# Patient Record
Sex: Female | Born: 1980 | Race: Black or African American | Hispanic: No | Marital: Single | Smoking: Current some day smoker
Health system: Southern US, Community
[De-identification: ages and names within clinical notes are randomized; demographics above are authoritative.]

## PROBLEM LIST (undated history)

## (undated) DIAGNOSIS — F32A Depression, unspecified: Secondary | ICD-10-CM

## (undated) DIAGNOSIS — F329 Major depressive disorder, single episode, unspecified: Secondary | ICD-10-CM

## (undated) DIAGNOSIS — R569 Unspecified convulsions: Secondary | ICD-10-CM

## (undated) HISTORY — PX: ECTOPIC PREGNANCY SURGERY: SHX613

---

## 2005-10-31 DIAGNOSIS — R569 Unspecified convulsions: Secondary | ICD-10-CM

## 2005-10-31 HISTORY — DX: Unspecified convulsions: R56.9

## 2005-11-11 ENCOUNTER — Ambulatory Visit (HOSPITAL_COMMUNITY): Admission: RE | Admit: 2005-11-11 | Discharge: 2005-11-11 | Payer: Self-pay | Admitting: Obstetrics & Gynecology

## 2005-12-11 ENCOUNTER — Emergency Department (HOSPITAL_COMMUNITY): Admission: EM | Admit: 2005-12-11 | Discharge: 2005-12-11 | Payer: Self-pay | Admitting: *Deleted

## 2005-12-28 ENCOUNTER — Ambulatory Visit (HOSPITAL_COMMUNITY): Admission: RE | Admit: 2005-12-28 | Discharge: 2005-12-28 | Payer: Self-pay | Admitting: Obstetrics & Gynecology

## 2006-05-05 ENCOUNTER — Ambulatory Visit (HOSPITAL_COMMUNITY): Admission: RE | Admit: 2006-05-05 | Discharge: 2006-05-05 | Payer: Self-pay | Admitting: Obstetrics & Gynecology

## 2006-05-09 ENCOUNTER — Inpatient Hospital Stay (HOSPITAL_COMMUNITY): Admission: AD | Admit: 2006-05-09 | Discharge: 2006-05-12 | Payer: Self-pay | Admitting: Obstetrics

## 2006-07-13 ENCOUNTER — Ambulatory Visit (HOSPITAL_COMMUNITY): Admission: RE | Admit: 2006-07-13 | Discharge: 2006-07-13 | Payer: Self-pay | Admitting: Obstetrics & Gynecology

## 2006-07-17 ENCOUNTER — Inpatient Hospital Stay (HOSPITAL_COMMUNITY): Admission: AD | Admit: 2006-07-17 | Discharge: 2006-07-17 | Payer: Self-pay | Admitting: Obstetrics

## 2006-08-18 ENCOUNTER — Emergency Department (HOSPITAL_COMMUNITY): Admission: EM | Admit: 2006-08-18 | Discharge: 2006-08-18 | Payer: Self-pay | Admitting: *Deleted

## 2008-02-08 ENCOUNTER — Encounter: Payer: Self-pay | Admitting: Emergency Medicine

## 2008-02-08 ENCOUNTER — Encounter (INDEPENDENT_AMBULATORY_CARE_PROVIDER_SITE_OTHER): Payer: Self-pay | Admitting: Obstetrics and Gynecology

## 2008-02-08 ENCOUNTER — Observation Stay (HOSPITAL_COMMUNITY): Admission: AD | Admit: 2008-02-08 | Discharge: 2008-02-09 | Payer: Self-pay | Admitting: Obstetrics and Gynecology

## 2008-02-12 ENCOUNTER — Inpatient Hospital Stay (HOSPITAL_COMMUNITY): Admission: AD | Admit: 2008-02-12 | Discharge: 2008-02-12 | Payer: Self-pay | Admitting: Obstetrics & Gynecology

## 2008-02-18 ENCOUNTER — Inpatient Hospital Stay (HOSPITAL_COMMUNITY): Admission: AD | Admit: 2008-02-18 | Discharge: 2008-02-18 | Payer: Self-pay | Admitting: Obstetrics and Gynecology

## 2008-02-20 ENCOUNTER — Inpatient Hospital Stay (HOSPITAL_COMMUNITY): Admission: AD | Admit: 2008-02-20 | Discharge: 2008-02-21 | Payer: Self-pay | Admitting: Obstetrics and Gynecology

## 2008-02-21 ENCOUNTER — Encounter (INDEPENDENT_AMBULATORY_CARE_PROVIDER_SITE_OTHER): Payer: Self-pay | Admitting: Obstetrics and Gynecology

## 2008-02-28 ENCOUNTER — Inpatient Hospital Stay (HOSPITAL_COMMUNITY): Admission: AD | Admit: 2008-02-28 | Discharge: 2008-02-28 | Payer: Self-pay | Admitting: Obstetrics and Gynecology

## 2008-04-09 ENCOUNTER — Emergency Department (HOSPITAL_COMMUNITY): Admission: EM | Admit: 2008-04-09 | Discharge: 2008-04-10 | Payer: Self-pay | Admitting: Emergency Medicine

## 2008-06-26 ENCOUNTER — Observation Stay (HOSPITAL_COMMUNITY): Admission: EM | Admit: 2008-06-26 | Discharge: 2008-06-27 | Payer: Self-pay | Admitting: Emergency Medicine

## 2008-06-26 ENCOUNTER — Ambulatory Visit: Payer: Self-pay | Admitting: Family Medicine

## 2008-06-26 DIAGNOSIS — N83209 Unspecified ovarian cyst, unspecified side: Secondary | ICD-10-CM

## 2008-07-04 ENCOUNTER — Emergency Department (HOSPITAL_COMMUNITY): Admission: EM | Admit: 2008-07-04 | Discharge: 2008-07-04 | Payer: Self-pay | Admitting: Emergency Medicine

## 2008-07-13 IMAGING — CT CT PELVIS W/ CM
2 of 5 series · 11 of 36 positions shown, 18 images · IV contrast ([ID]/WATER & 100 ML OMNI 300)
Comparison: None

CT ABDOMEN

CLINICAL DATA: Pelvic pain, right lower quadrant pain, vaginal
bleeding

CT ABDOMEN AND PELVIS WITH CONTRAST
TECHNIQUE: Multidetector CT imaging of the abdomen and pelvis was
performed using the standard protocol following bolus
administration of intravenous contrast.
Contrast: 100 ml [id number]

[Series 2: routine abdomen · axial · 0.85mm/px · z∈[-443,-83]mm · 10 of 89 slices shown, 16 images]
[im 9/89  soft-tissue]
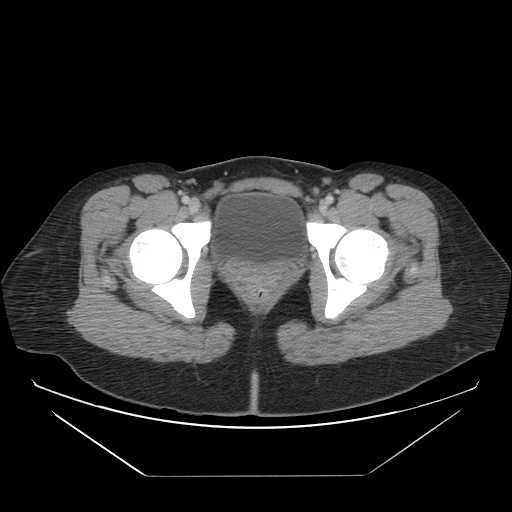
[im 9/89  bone]
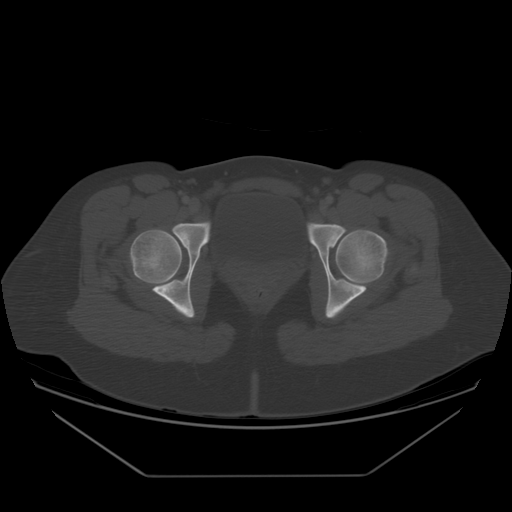
[im 17/89  soft-tissue]
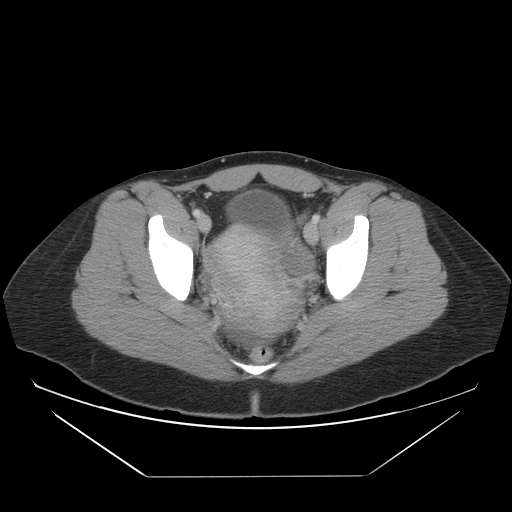
[im 25/89  soft-tissue]
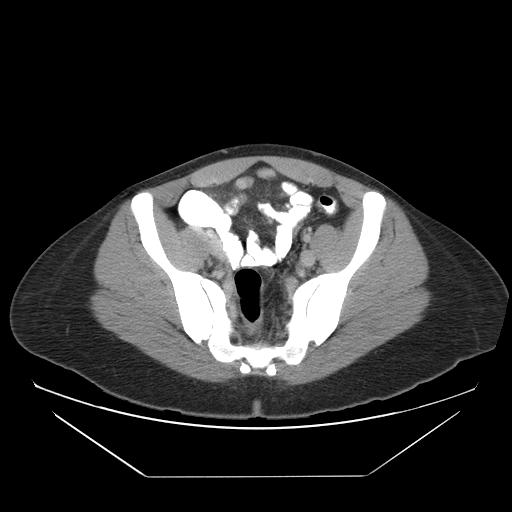
[im 33/89  soft-tissue]
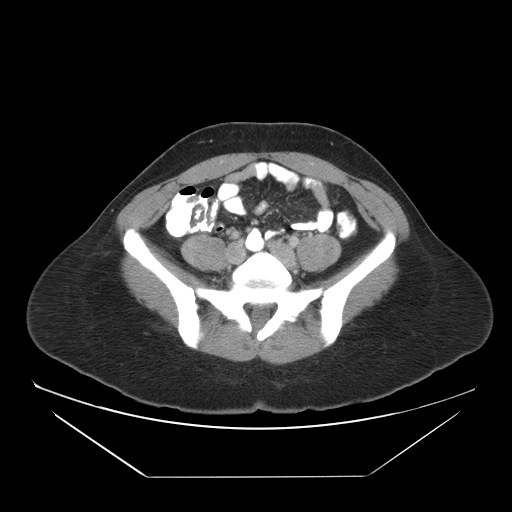
[im 41/89  soft-tissue]
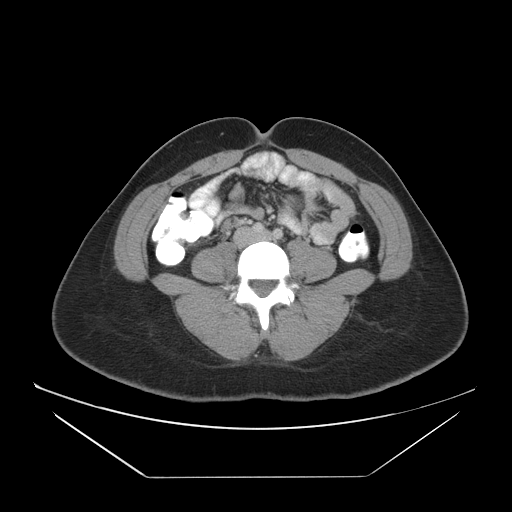
[im 49/89  soft-tissue]
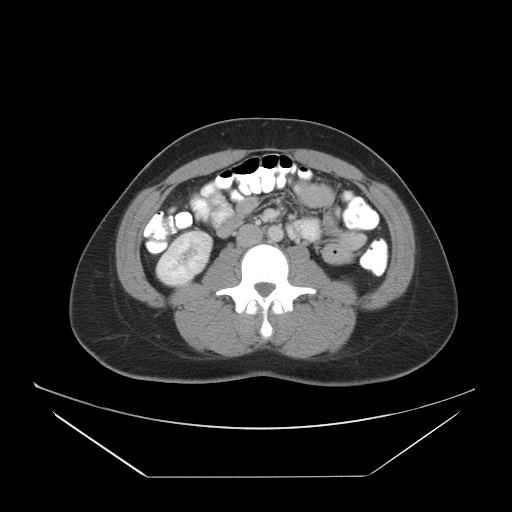
[im 57/89  soft-tissue]
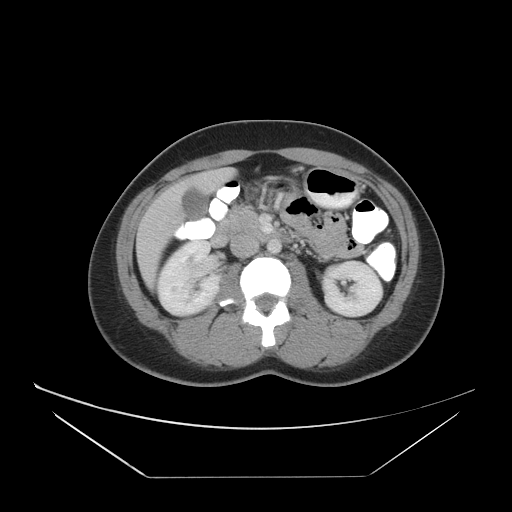
[im 57/89  lung]
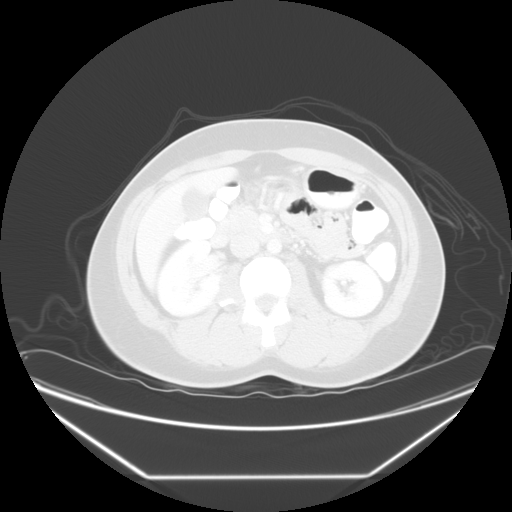
[im 65/89  soft-tissue]
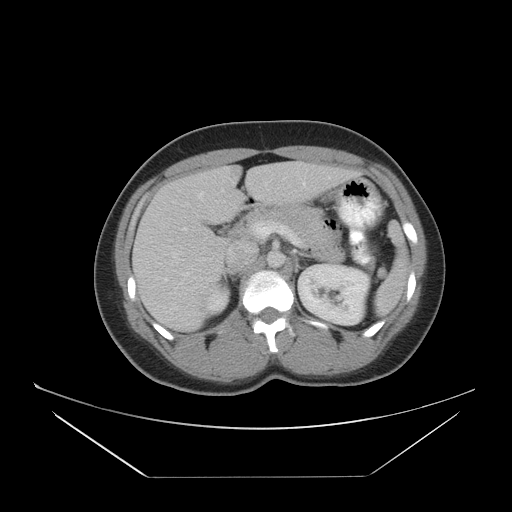
[im 65/89  lung]
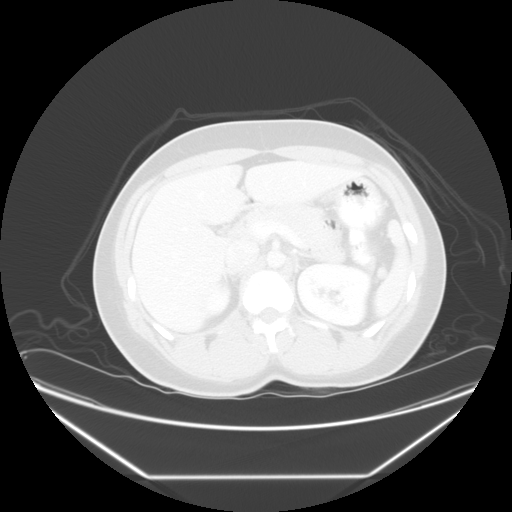
[im 73/89  soft-tissue]
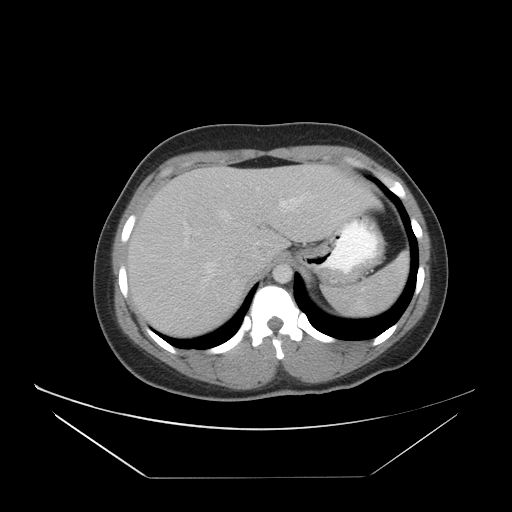
[im 73/89  lung]
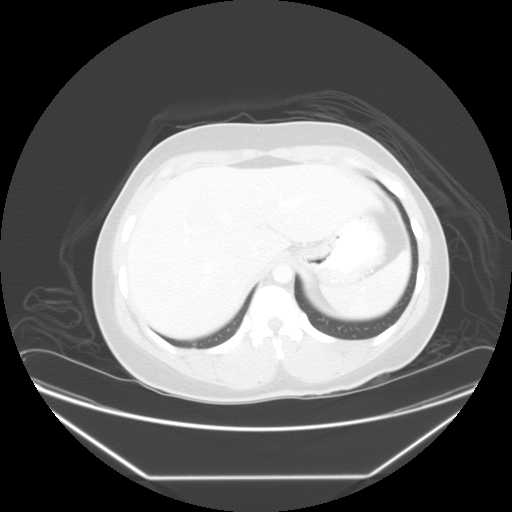
[im 73/89  bone]
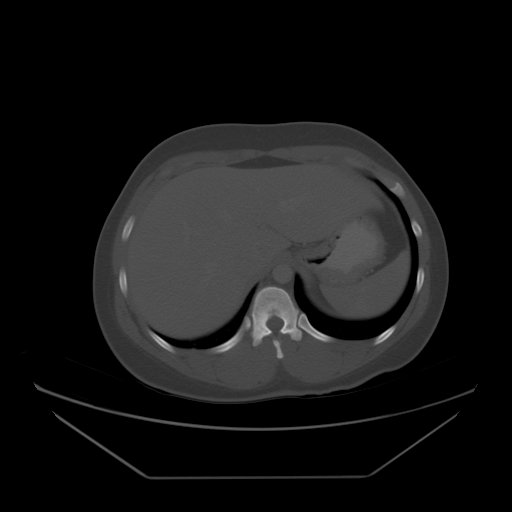
[im 81/89  soft-tissue]
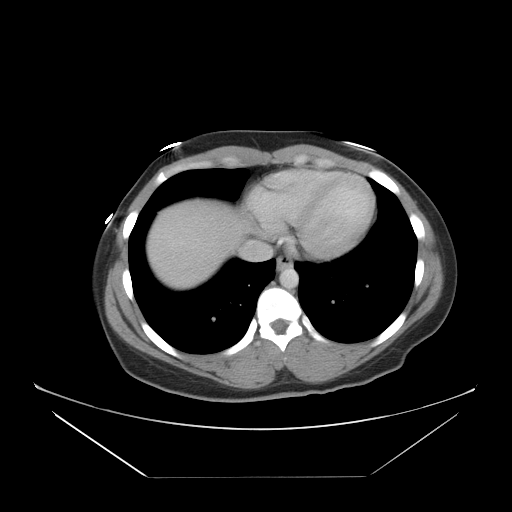
[im 81/89  lung]
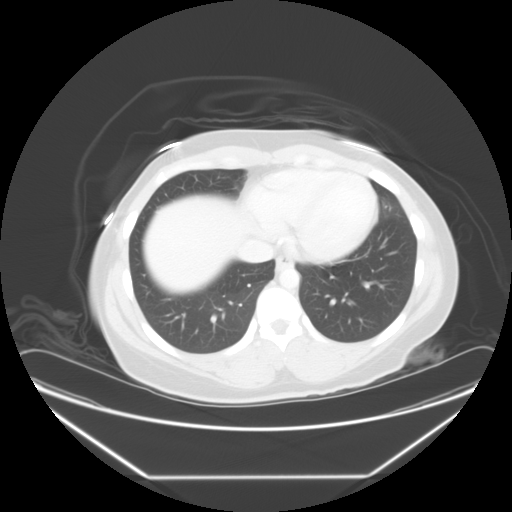

[Series 400: reformatted · sagittal · 0.92mm/px · 1 of 121 slices shown, 2 images]
[im 41/121  soft-tissue]
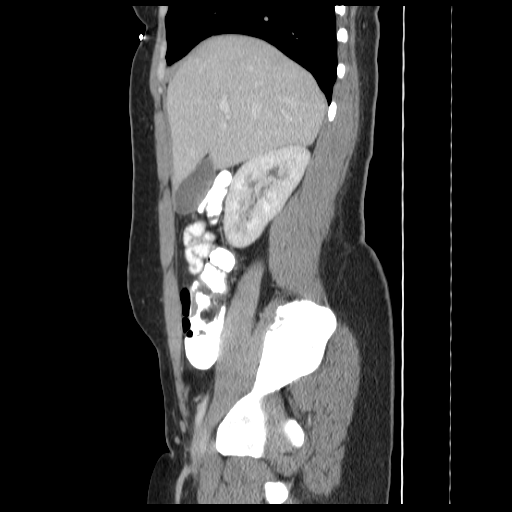
[im 41/121  bone]
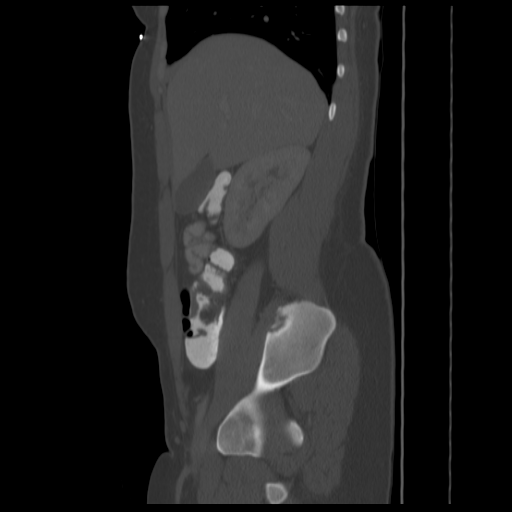

[11 of 36 positions shown; findings below may reference images not displayed]

FINDINGS: Liver, spleen, pancreas, adrenals, kidneys unremarkable.
No hydronephrosis. No biliary ductal dilatation. Gallbladder
unremarkable.

Bowel grossly unremarkable.  No free fluid, free air, or
adenopathy.

Lung bases are clear.  No effusions.  Heart is normal size.
IMPRESSION: No acute findings in the abdomen.

CT PELVIS
FINDINGS: Appendix is visualized and is normal.  Small to moderate
amount of free fluid in the pelvis.  Question ruptured cyst.

No free air or adenopathy.  Bowel grossly unremarkable.
IMPRESSION: Small to moderate amount of free fluid in the pelvis.  This could
be related to ruptured cyst.

Appendix normal.

## 2008-07-28 ENCOUNTER — Ambulatory Visit: Payer: Self-pay | Admitting: Nurse Practitioner

## 2008-07-28 DIAGNOSIS — G479 Sleep disorder, unspecified: Secondary | ICD-10-CM | POA: Insufficient documentation

## 2008-07-28 DIAGNOSIS — F411 Generalized anxiety disorder: Secondary | ICD-10-CM | POA: Insufficient documentation

## 2008-07-28 DIAGNOSIS — F329 Major depressive disorder, single episode, unspecified: Secondary | ICD-10-CM | POA: Insufficient documentation

## 2008-07-29 DIAGNOSIS — F17200 Nicotine dependence, unspecified, uncomplicated: Secondary | ICD-10-CM | POA: Insufficient documentation

## 2008-07-30 DIAGNOSIS — R569 Unspecified convulsions: Secondary | ICD-10-CM | POA: Insufficient documentation

## 2008-08-13 ENCOUNTER — Ambulatory Visit: Payer: Self-pay | Admitting: Nurse Practitioner

## 2008-09-01 ENCOUNTER — Ambulatory Visit: Payer: Self-pay | Admitting: Nurse Practitioner

## 2008-09-01 LAB — CONVERTED CEMR LAB
AST: 16 units/L (ref 0–37)
BUN: 8 mg/dL (ref 6–23)
Basophils Relative: 0 % (ref 0–1)
Calcium: 9.7 mg/dL (ref 8.4–10.5)
Chloride: 103 meq/L (ref 96–112)
Cholesterol: 160 mg/dL (ref 0–200)
Creatinine, Ser: 0.73 mg/dL (ref 0.40–1.20)
Eosinophils Absolute: 0.3 10*3/uL (ref 0.0–0.7)
Glucose, Bld: 80 mg/dL (ref 70–99)
HDL: 42 mg/dL (ref >39)
Hemoglobin: 12.7 g/dL (ref 12.0–15.0)
MCHC: 33.3 g/dL (ref 30.0–36.0)
MCV: 89.9 fL (ref 78.0–100.0)
Monocytes Absolute: 0.9 10*3/uL (ref 0.1–1.0)
Monocytes Relative: 7 % (ref 3–12)
Neutro Abs: 8.1 10*3/uL — ABNORMAL HIGH (ref 1.7–7.7)
RBC: 4.24 M/uL (ref 3.87–5.11)
TSH: 1.767 microintl units/mL (ref 0.350–4.50)
Total CHOL/HDL Ratio: 3.8
Triglycerides: 103 mg/dL (ref <150)

## 2008-09-02 ENCOUNTER — Encounter (INDEPENDENT_AMBULATORY_CARE_PROVIDER_SITE_OTHER): Payer: Self-pay | Admitting: Nurse Practitioner

## 2008-10-02 ENCOUNTER — Other Ambulatory Visit: Admission: RE | Admit: 2008-10-02 | Discharge: 2008-10-02 | Payer: Self-pay | Admitting: Internal Medicine

## 2008-10-02 ENCOUNTER — Ambulatory Visit: Payer: Self-pay | Admitting: Nurse Practitioner

## 2008-10-02 ENCOUNTER — Encounter (INDEPENDENT_AMBULATORY_CARE_PROVIDER_SITE_OTHER): Payer: Self-pay | Admitting: Nurse Practitioner

## 2008-10-02 DIAGNOSIS — J309 Allergic rhinitis, unspecified: Secondary | ICD-10-CM | POA: Insufficient documentation

## 2008-10-02 DIAGNOSIS — D72829 Elevated white blood cell count, unspecified: Secondary | ICD-10-CM | POA: Insufficient documentation

## 2008-10-02 LAB — CONVERTED CEMR LAB
Bilirubin Urine: NEGATIVE
Blood in Urine, dipstick: NEGATIVE
KOH Prep: NEGATIVE
Ketones, urine, test strip: NEGATIVE
Protein, U semiquant: NEGATIVE
Urobilinogen, UA: 0.2
pH: 6

## 2008-10-09 LAB — CONVERTED CEMR LAB
Chlamydia, DNA Probe: NEGATIVE
GC Probe Amp, Genital: NEGATIVE
Lymphs Abs: 3.1 10*3/uL (ref 0.7–4.0)
Monocytes Relative: 5 % (ref 3–12)
Neutro Abs: 9.2 10*3/uL — ABNORMAL HIGH (ref 1.7–7.7)
Neutrophils Relative %: 70 % (ref 43–77)
Platelets: 454 10*3/uL — ABNORMAL HIGH (ref 150–400)
RBC: 4.23 M/uL (ref 3.87–5.11)
WBC: 13.2 10*3/uL — ABNORMAL HIGH (ref 4.0–10.5)

## 2008-10-21 ENCOUNTER — Encounter (INDEPENDENT_AMBULATORY_CARE_PROVIDER_SITE_OTHER): Payer: Self-pay | Admitting: *Deleted

## 2010-02-11 ENCOUNTER — Emergency Department (HOSPITAL_COMMUNITY): Admission: EM | Admit: 2010-02-11 | Discharge: 2010-02-11 | Payer: Self-pay | Admitting: Emergency Medicine

## 2010-09-15 ENCOUNTER — Inpatient Hospital Stay (HOSPITAL_COMMUNITY): Admission: AD | Admit: 2010-09-15 | Discharge: 2010-09-15 | Payer: Self-pay | Admitting: Obstetrics and Gynecology

## 2010-10-07 ENCOUNTER — Ambulatory Visit: Payer: Self-pay | Admitting: Obstetrics and Gynecology

## 2010-11-21 ENCOUNTER — Encounter: Payer: Self-pay | Admitting: Obstetrics & Gynecology

## 2010-11-28 ENCOUNTER — Emergency Department (HOSPITAL_COMMUNITY)
Admission: EM | Admit: 2010-11-28 | Discharge: 2010-11-28 | Payer: Self-pay | Source: Home / Self Care | Admitting: Emergency Medicine

## 2011-01-04 ENCOUNTER — Inpatient Hospital Stay (HOSPITAL_COMMUNITY)
Admission: AD | Admit: 2011-01-04 | Discharge: 2011-01-05 | Disposition: A | Discharge status: Home / Self Care | Payer: Medicaid Other | Source: Ambulatory Visit | Attending: Obstetrics & Gynecology | Admitting: Obstetrics & Gynecology

## 2011-01-04 ENCOUNTER — Inpatient Hospital Stay (HOSPITAL_COMMUNITY): Payer: Medicaid Other

## 2011-01-04 DIAGNOSIS — N8000 Endometriosis of the uterus, unspecified: Secondary | ICD-10-CM | POA: Insufficient documentation

## 2011-01-04 DIAGNOSIS — N946 Dysmenorrhea, unspecified: Secondary | ICD-10-CM | POA: Insufficient documentation

## 2011-01-04 LAB — URINALYSIS, ROUTINE W REFLEX MICROSCOPIC
Glucose, UA: NEGATIVE mg/dL
Leukocytes, UA: NEGATIVE
Nitrite: NEGATIVE
Protein, ur: NEGATIVE mg/dL
pH: 6 (ref 5.0–8.0)

## 2011-01-04 LAB — WET PREP, GENITAL: Yeast Wet Prep HPF POC: NONE SEEN

## 2011-01-04 LAB — URINE MICROSCOPIC-ADD ON

## 2011-01-04 LAB — CBC
Hemoglobin: 12 g/dL (ref 12.0–15.0)
MCH: 29.8 pg (ref 26.0–34.0)
MCHC: 33.1 g/dL (ref 30.0–36.0)
MCV: 90.1 fL (ref 78.0–100.0)
RBC: 4.03 MIL/uL (ref 3.87–5.11)

## 2011-01-05 LAB — GC/CHLAMYDIA PROBE AMP, GENITAL: GC Probe Amp, Genital: NEGATIVE

## 2011-01-11 LAB — URINALYSIS, ROUTINE W REFLEX MICROSCOPIC
Ketones, ur: NEGATIVE mg/dL
Leukocytes, UA: NEGATIVE
Nitrite: NEGATIVE
Protein, ur: NEGATIVE mg/dL
Urobilinogen, UA: 1 mg/dL (ref 0.0–1.0)
pH: 6 (ref 5.0–8.0)

## 2011-01-11 LAB — WET PREP, GENITAL
Clue Cells Wet Prep HPF POC: NONE SEEN
Trich, Wet Prep: NONE SEEN
Yeast Wet Prep HPF POC: NONE SEEN

## 2011-01-11 LAB — CBC
MCH: 31.4 pg (ref 26.0–34.0)
Platelets: 385 10*3/uL (ref 150–400)
RBC: 3.92 MIL/uL (ref 3.87–5.11)

## 2011-01-11 LAB — GC/CHLAMYDIA PROBE AMP, GENITAL: Chlamydia, DNA Probe: NEGATIVE

## 2011-01-11 LAB — URINE MICROSCOPIC-ADD ON

## 2011-01-19 LAB — URINALYSIS, ROUTINE W REFLEX MICROSCOPIC
Bilirubin Urine: NEGATIVE
Ketones, ur: NEGATIVE mg/dL
Nitrite: NEGATIVE
Protein, ur: NEGATIVE mg/dL
Urobilinogen, UA: 0.2 mg/dL (ref 0.0–1.0)

## 2011-01-19 LAB — POCT I-STAT, CHEM 8
BUN: 8 mg/dL (ref 6–23)
Calcium, Ion: 1.12 mmol/L (ref 1.12–1.32)
Chloride: 102 mEq/L (ref 96–112)
Creatinine, Ser: 0.9 mg/dL (ref 0.4–1.2)
Glucose, Bld: 81 mg/dL (ref 70–99)
HCT: 38 % (ref 36.0–46.0)
Hemoglobin: 12.9 g/dL (ref 12.0–15.0)
Potassium: 3.5 meq/L (ref 3.5–5.1)
Sodium: 138 mEq/L (ref 135–145)
TCO2: 27 mmol/L (ref 0–100)

## 2011-01-19 LAB — PREGNANCY, URINE: Preg Test, Ur: NEGATIVE

## 2011-01-19 LAB — URINE MICROSCOPIC-ADD ON

## 2011-01-26 ENCOUNTER — Encounter: Payer: Medicaid Other | Admitting: Family Medicine

## 2011-03-15 NOTE — Discharge Summary (Signed)
NAMESHOSHANA, Kristen Roberts              ACCOUNT NO.:  [id number]   MEDICAL RECORD NO.:  [id number]          PATIENT TYPE:  INP   LOCATION:  9307                          FACILITY:  [facility]   PHYSICIAN:  Kendra H. Kristen Craw, MD     DATE OF BIRTH:  18-Nov-1980   DATE OF ADMISSION:  02/08/2008  DATE OF DISCHARGE:  02/09/2008                               DISCHARGE SUMMARY   HOSPITAL DIAGNOSES:  Pregnancy of unknown location, suspected ectopic.   HOSPITAL PROCEDURES:  1. Serial hemoglobin.  2. Serial quantitative hCGs.  3. Pelvic ultrasound.  4. Intramuscular methotrexate.   HOSPITAL COURSE:  Kristen Roberts is a 30 year old G4, P2-0-2-2 who  originally presented to the emergency room at [facility]  complaining of left lower quadrant pain on February 08, 2008.  She had  previously undergone tubal sterilization with a laparoscopic tubal  procedure and was experiencing vaginal bleeding.  A urine pregnancy test  was positive and a quantitative hCG returned 260.  A pelvic ultrasound  was obtained which demonstrated a normal appearing uterus with a thin  endometrial stripe measuring 3 mm.  Right adnexa was within normal  limits with multiple small follicles and normal color Doppler signal.  The left ovary was also of normal size.  It did contain a small complex  cyst measuring 1 cm in size with again normal Doppler signal.  No masses  were noted.  There was noted to be a moderate amount of free fluid in  the posterior cul-de-sac surrounding the right adnexa.  Additionally in  the emergency room the patient did experience some passage of tissues.  This was collected and sent to pathology in formalin.  However, given  its placement in formalin the pathology could not be evaluated until  February 09, 2008.  A repeat quantitative hCG was obtained on April 10  which had fallen from 260 to 168.  CMET for liver function tests, BUN  and creatinine was obtained.  Given her history of tubal ligation there  was concern  for ectopic pregnancy.  Given that this was an undesired  pregnancy the patient was given methotrexate 50 mg per meter squared IM  x1.  She was evaluated with serial hemoglobins which remained stable.  She was maintained in the hospital overnight.  She remained  hemodynamically stable.  She never developed any pain or tenderness.  Her hemoglobin remained stable between 11 and 12.  On the morning of  April 11 I was contacted by the pathologist and notified that no villi  were identified on the pathologic specimen which was consistent with  ectopic specimen.  The patient was informed of the pathologic findings.  Options of medical versus surgical management were discussed with the  patient.  It was felt that because she was hemodynamically stable  without pain and with a normal hemoglobin and had received IM  methotrexate that the best followup would be to have a repeat evaluation  of a quantitative hCG on Tuesday, April 14.  She understands that she  may require a repeat dose of methotrexate versus surgical management.  She was instructed  to return to the emergency room for worsening  bleeding, worsening pain.  She understands the risks of a ruptured  ectopic and its life threatening nature.  The patient will follow up in  the maternity admissions on April 14 to have repeat quantitative hCG  drawn and again on April 17 for another repeat quantitative hCG.     Kristen Roberts. Kristen Craw, MD  Electronically Signed    KHR/MEDQ  D:  02/09/2008  T:  02/09/2008  Job:  [id number]

## 2011-03-15 NOTE — H&P (Signed)
NAMEJENNI, Kristen Roberts              ACCOUNT NO.:  [id number]   MEDICAL RECORD NO.:  [id number]          PATIENT TYPE:  OBV   LOCATION:  3738                         FACILITY:  [facility]   PHYSICIAN:  Leighton Roach McDiarmid, M.D.DATE OF BIRTH:  1981-05-25   DATE OF ADMISSION:  06/26/2008  DATE OF DISCHARGE:                              HISTORY & PHYSICAL   PRIMARY CARE PHYSICIAN:  None.   CHIEF COMPLAINT:  Seizure.   HISTORY OF PRESENT ILLNESS:  This is a 30 year old African American  female with a history of a seizure disorder.  She reports that she had  her first seizure when she was pregnant with her son in 2001; however,  she denies any history of preeclampsia or hypertension while she was  pregnant.  She was started at that time on Dilantin, but reports that  she still had approximately 6-7 seizures per week while taking this  medication, so her doctors then switched her to Depakote.  Her seizures  were controlled with Depakote; however, she stopped the Depakote in 2002  due to weight gain.  She says that she had a full workup at [facility] that included an MRI, an EEG, and a head CT scan.  The patient then did not have any more seizures until October 2007 when  she came to the [facility] Emergency Room.  Her seizure at that time was  actually several days after delivering her second child.  The patient  was worked up in the emergency room and instructed to follow up with  [facility]; however, she never followed up in their office.  The  patient presents today because this morning she had another seizure.  She reports that she felt like her vision was coming and going and she  felt dizzy.  She states that she felt like she was getting ready to have  a seizure, so she went to lay down.  Approximately 10 minutes later, the  patient had a witnessed tonic-clonic seizure that was witnessed by both  her mother and her significant other.  The seizure lasted  approximately  2 minutes and apparently she had some difficulty breathing with the  seizure.  She bit her tongue and also had some urinary incontinence.  Upon arrival to the emergency room, she had a second seizure that was  witnessed by the emergency room physician, again the seizure was tonic-  clonic and she had urinary incontinence.  She received Ativan 2 mg in  the emergency room.  Of note, the patient was started on Seroquel  approximately 4 days ago by the [facility] for a history of  depression and insomnia.   REVIEW OF SYSTEMS:  The patient does not have any fever or cold  symptoms.  No urinary symptoms.  She does complain of some abdominal  pain, which she attributes to menstrual cramping and she also has some  chronic insomnia.   PAST MEDICAL HISTORY:  1. Known seizure disorder.  2. Bilateral tubal ligation in September 2007.  3. Ectopic pregnancy with left salpingectomy in April 2009.   MEDICATIONS:  1. Zoloft  300 mg, which she has not yet started.  2. Seroquel 50 mg daily, which she started 4 days ago.   ALLERGIES:  No known drug allergies.   FAMILY HISTORY:  There is no history of any seizure disorder.  Her  mother is healthy.  She is unsure of her father's history.  She has 2  children that are 5 and 17 years old, both of whom are healthy.   SOCIAL HISTORY:  The patient moved to [place] from [place].  She  currently lives with her mother and has 2 children at home.  She does  smoke cigarettes; however, uses alcohol occasionally and denies any  illicit drug use.   PHYSICAL EXAMINATION:  VITAL SIGNS:  Temperature 99.1, blood pressure  128/86, pulse 97, respirations 20, and she is 100% on room air.  GENERAL:  The patient is groggy, she is slow to answer most questions;  however, is alert and oriented despite falling asleep during the initial  examination.  HEENT:  Mucous membranes are moist.  Extraocular movements are intact  and pupils are equal, round,  and reactive to light.  There is no scleral  icterus.  NECK:  Supple.  CARDIOVASCULAR:  She has a regular rate and rhythm without any murmur.  RESPIRATIONS:  Clear to auscultation bilaterally with normal respiratory  effort.  ABDOMEN:  Soft, nondistended, no masses with some mild right lower  quadrant tenderness with deep palpation.  NEURO:  Strength is 5/5 in both upper and lower extremities.  I am  unable to elicit patellar reflexes bilaterally.  There is no clonus.  No  facial droop.  Uvula is midline.  Tongue is midline.   LABORATORY DATA:  CBC shows a white count of 14.5 and is otherwise  normal.  A comprehensive metabolic panel is all within normal limits.  Her urine pregnancy test is negative.  Urinalysis is significant for  small leukocyte esterase with 0-2 white cells and 0-2 red blood cells.   IMAGING:  Chest x-ray shows no active lung disease.  There is a question  of a small granuloma in the right upper lung field.  Head CT is within  normal limits.   ASSESSMENT:  This is a 30 year old [place] American female with a known  history of a seizure disorder with her last seizure in October 2007  after the delivery of her child.  She had a witnessed tonic-clonic  seizure episode twice today.  This is in the setting of recently  starting Seroquel 4 days ago.  Her initial lab work including a CBC,  CMET, urinalysis, chest x-ray, and a head CT is all normal.  I have  spoken with Dr. Vickey Huger with [facility] who plans to see the  patient and will perform an EEG in the morning.  She has suggested  starting the patient on Keppra with an initial loading dose.  The  patient will be admitted for observation with seizure precautions.  For  now, I will hold her Zoloft and Seroquel medications due to possible  lowering of the seizure threshold for this patient.      Sylvan Cheese, M.D.  Electronically Signed      Leighton Roach McDiarmid, M.D.  Electronically Signed    MJ/MEDQ   D:  06/26/2008  T:  06/27/2008  Job:  [id number]

## 2011-03-15 NOTE — Discharge Summary (Signed)
Kristen Roberts, Kristen Roberts              ACCOUNT NO.:  [id number]   MEDICAL RECORD NO.:  [id number]          PATIENT TYPE:  OBV   LOCATION:  3738                         FACILITY:  [facility]   PHYSICIAN:  Leighton Roach McDiarmid, M.D.DATE OF BIRTH:  [birth year]   DATE OF ADMISSION:  06/26/2008  DATE OF DISCHARGE:  06/27/2008                               DISCHARGE SUMMARY   PRIMARY CARE Ronelle Michie:  Gentry Fitz.   DISCHARGE DIAGNOSES:  1. Seizures, Generalized Tonic-Clonic  2. Probable left hemorrhagic cyst in ovary.   DISCHARGE MEDICATIONS:  Keppra 500 mg 1 tablet by mouth twice a day.   Stop taking these medications, Seroquel and sertraline.   CONSULT:  Neurology, recommended outpatient EEG.   PROCEDURES:  1. Transvaginal Ultrasound:  Impression, 3 x 1 cm probable hemorrhagic      left ovarian cyst.  Recommended followup limited transvaginal      pelvic ultrasound for evaluation of the left ovary in 8 weeks.  2. CT Scan of the Head:  Impression, no acute intracranial      abnormality.   LABORATORY:  1. CBC, WBC 14.5, hemoglobin 12.2, hematocrit 36.0, and platelets 446.  2. Urine pregnancy negative.  3. UA negative.  4. Urine drug screen positive for benzodiazepines.  5. GC and Chlamydia negative.   BRIEF HOSPITAL COURSE:  The patient is a 30 year old female with known  seizure disorder who came to [facility] with a seizure after  recently starting Seroquel and sertraline.  1. Seizure:  The patient had a seizure prior to admission and a      seizure in the emergency department witnessed by a physician.  The      patient has a history of known seizure disorder, usually associated      with her pregnancies.  The patient had a generalized tonic-clonic      seizure in the ER.  The patient was recently started on Seroquel      and sertraline for depression and insomnia.  We believe that since      Seroquel can lower seizure threshold, this is what caused her to      have these  seizures.  The patient's Seroquel and sertraline were      held.  Neurology was consulted.  They have recommended loading with      Keppra and then giving Keppra 500 mg p.o. b.i.d.  Since the Keppra      was started, the patient has not had any seizures and has no      further complaints.  Dr. Pearlean Brownie with [facility]      came by and evaluated the patient and recommended continue with      Keppra and to schedule an outpatient EEG.  On the day of discharge,      the patient was stable, was having no further seizures, and was      agreeable with discharge.  2. Right Lower Quadrant Pain:  The patient was complaining of some      right lower quadrant pain on admission.  The patient does have a  history of ectopic pregnancy and a possible ovarian cyst.  GC and      Chlamydia were negative, and a urine pregnancy test was also      negative.  A transvaginal ultrasound showed probable left      hemorrhagic cyst in her ovary.  Recommended followup for this is a      transvaginal ultrasound in 8 weeks to reevaluate the left ovary.   DISCHARGE INSTRUCTIONS:  The patient is to have no restrictions on her  diet.  The patient is to increase activity slowly.   FOLLOWUP APPOINTMENTS:  The patient is to return to [facility], phone number [phone].  [facility]  is supposed to call and let the patient know about the appointment.  If  the patient does not hear from them, she is to call and schedule a  followup appointment.  This followup appointment is necessary in order  to get the outpatient EEG that was recommended.   DISCHARGE CONDITION:  The patient was discharged home in stable medical  condition.      Angelena Sole, MD  Electronically Signed      Leighton Roach McDiarmid, M.D.  Electronically Signed    WS/MEDQ  D:  06/27/2008  T:  06/28/2008  Job:  [id number]   cc:   Dr. Pearlean Brownie

## 2011-03-15 NOTE — Consult Note (Signed)
NAMEHAYA, HEMLER              ACCOUNT NO.:  [id number]   MEDICAL RECORD NO.:  [id number]          PATIENT TYPE:  MAT   LOCATION:  MATC                          [facility]ILITY[facility]   PHYSICIAN:  Kendra H. Tenny Craw, MD     DATE OF BIRTH:  May 11, 1981   DATE OF CONSULTATION:  02/19/2008  DATE OF DISCHARGE:  02/18/2008                                 CONSULTATION   TELEPHONE CONSULTATION:   CHIEF COMPLAINT:  Suspected ectopic.   HISTORY OF PRESENT ILLNESS:  Ms. Braxton is a 30 year old G4, P2, who is  status post hospitalization on February 08, 2008 through February 09, 2008,  for pregnancy of unknown location and suspected ectopic.  She originally  presented with lower abdominal pain and vaginal bleeding and was found  to have a positive pregnancy test with a quantitative hCG of 260.  She  had undergone a laparoscopic tubal ligation procedure in September 2007.  The patient was followed with serial hemoglobins and quantitative hCGs.  She did receive methotrexate on February 08, 2008.  Her quantitative hCG in  follow up on February 12, 2008, was 146.  She was scheduled to come on  February 15, 2008, for repeat quantitative hCG, however, this did not get  drawn until February 18, 2008, and it returned 111.  I spoke with her today  on the telephone regarding the need for further follow up to completely  follow her quantitative hCGs down to 0.  She will return to maternity  admissions on Monday, February 25, 2008, for a repeat quantitative hCG.  She has decided on a Mirena intrauterine device for contraception and  will schedule an appointment in our office to have this placed.      Freddrick March. Tenny Craw, MD  Electronically Signed     KHR/MEDQ  D:  02/19/2008  T:  02/19/2008  Job:  [id number]

## 2011-03-15 NOTE — Op Note (Signed)
Kristen Roberts, Kristen Roberts              ACCOUNT NO.:  [id number]   MEDICAL RECORD NO.:  [id number]          PATIENT TYPE:  MAT   LOCATION:  MATC                          [facility]   PHYSICIAN:  Carrington Clamp, M.D. DATE OF BIRTH:  [birth year]   DATE OF PROCEDURE:  02/21/2008  DATE OF DISCHARGE:                               OPERATIVE REPORT   PREOPERATIVE DIAGNOSIS:  Ectopic pregnancy.   POSTOPERATIVE DIAGNOSIS:  Ectopic pregnancy plus left-sided ectopic  pregnancy.   PROCEDURE:  Operative laparoscopy with left salpingectomy.   ATTENDING SURGEON:  Carrington Clamp, M.D.   ANESTHESIA:  general endotracheal anesthesia.   ESTIMATED BLOOD LOSS:  200 mL of fluid in the abdomen but minimal blood  loss during surgery.   IV FLUIDS:  1900 mL.   URINE OUTPUT:  Not measured.   MEDICATIONS:  None.   COMPLICATIONS:  None.   FINDINGS:  Left ectopic pregnancy, right tube seemed to be interrupted  from tubal ligation.  Right ovarian cyst was seen.  Simple cyst was  seen.  There were positive Fitz-Hugh-Curtis adhesions of the liver.  There were adhesions of the ectopic to the epiploica of the descending  sigmoid.  Otherwise no other pathology was seen.  The appendix was not  seen.   PATHOLOGY:  Left tube and ectopic pregnancy.   TECHNIQUE:  After adequate general anesthesia was achieved, the patient  was prepped, draped in sterile fashion in dorsal lithotomy position.  Speculum placed in the vagina and the uterine manipulator placed inside  the uterus.  A catheter was used to drain the bladder during the  procedure.  Attention was turned to the abdomen where a 2 cm  infraumbilical incision was made with the scalpel.  The Veress needle  was passed into the abdomen without aspiration of bowel contents or  blood.  The abdomen was insufflated.  The 10 mm trocar placed without  complication.  Two 5 mm trocars were placed laterally to each of the  inferior epigastric arteries under direct  visualization of the scope.  A  blunt retractor was placed inside the abdomen and some adhesions of the  bowel epiploica were gently teased off of the ectopic pregnancy.  The  Gyrus triple polar cautery was then introduced and while the ectopic was  held on tension with the grasper, the triple polar was used to  successfully cauterize and incise the mesosalpinx of the tube off the  ovary.  The entire tube from ust 2 cm lateral to the cornua was then  able to be removed and placed into an endobag.  Bleeding was minimal at  the site.  The endobag was removed from the abdomen and sent to  pathology.  There was minimal bleeding at the site of the dissection and  the ovary and infundibulopelvic ligament were intact.  The ureters were  noted well out of the field of dissection.  Irrigation was performed and  the above findings noted,  notably the Fitz-Hugh-Curtis adhesions of the  liver.  All instruments were then withdrawn from the abdomen.  The  abdomen desufflated.   The 7  cm infraumbilical incision in the fascia was closed with a figure-  of-eight stitch of 2-0 Vicryl.  The skin incisions for the 5 mL trocar  sites were closed with a through-and-through stitch of 3-0 Vicryl.  All  the into the incisions were covered with Dermabond.  Instruments  withdrawn from the vagina and the patient returned to recovery room in  stable condition.      Carrington Clamp, M.D.  Electronically Signed     MH/MEDQ  D:  02/21/2008  T:  02/21/2008  Job:  [id number]

## 2011-03-15 NOTE — Consult Note (Signed)
NAMEALITA, Kristen Roberts              ACCOUNT NO.:  [id number]   MEDICAL RECORD NO.:  [id number]          PATIENT TYPE:  OBV   LOCATION:  3738                         FACILITY:  [facility]   PHYSICIAN:  Pramod P. Pearlean Brownie, MD    DATE OF BIRTH:  01-Aug-1981   DATE OF CONSULTATION:  DATE OF DISCHARGE:  06/27/2008                                 CONSULTATION   REFERRING PHYSICIAN:  Leighton Roach McDiarmid, M.D.   REASON FOR REFERRAL:  Seizures.   HISTORY OF PRESENT ILLNESS:  Ms. Kristen Roberts is a 30 year old African  American lady who had 2 partial-onset secondary generalized seizures  yesterday.  The patient states that she had a rough night the night  before and could not sleep well.  She in fact has chronic trouble  falling asleep and is on Seroquel for that.  When she woke up, she felt  an aura that she may have a seizure, this consists of feeling of  dizziness as well as zoning in and out.  She subsequently had a  generalized tonic-clonic seizure at home, which was witnessed by her  boyfriend.  EMS were called and en route she had a second seizure.  This  time, the boyfriend noticed that she had head jerking to the left and  jerking on one side of the body.  She bit her tongue during the seizure  and was incontinent of bladder.  She has slowly recovered and now is  back to her baseline.  She was treated with 2 mg of Ativan in the  emergency room.  She has subsequently been loaded after discussion with  Dr. Vickey Huger with 1500 mg of Keppra yesterday.  This morning, she  appears fine.  She denies any headaches or any neurological symptoms.  Her past neurological history is significant for history of seizure  disorder, first diagnosed in 76.  She states that the seizure  description is similar to what happened this time; she has an aura and  seizure starts up partially and then she loses consciousness and has  tonic-clonic activity, tongue-bite, and occasional incontinence.  She  used to have several  frequent seizures initially.  She was started on  Dilantin, and for unclear reason that was switched to valproic acid and  folic acid.  She was followed at [facility].  She  remembers having had an MRI and EEG done there, which were apparently  unremarkable.  She stopped the Depakote about 4 years ago and had a  couple of seizures during her pregnancy with her son 2 years ago but did  not take seizure medicines and had 2 seizures yesterday.   PAST MEDICAL HISTORY:  Significant for anxiety and depression.   HOME MEDICATIONS:  1. Zoloft.  2. Seroquel.   MEDICATION ALLERGIES:  None known.   PAST SURGICAL HISTORY:  1. Bilateral tubal ligation in September 2007.  2. Ectopic pregnancy with left salpingectomy in April 2009.   FAMILY HISTORY:  Nobody with epilepsy or seizures.   SOCIAL HISTORY:  She has moved to [place] from [place].  She lives  with her mother and  her 2 children.  She smokes.  She admits drinking  alcohol occasionally but denies doing drugs.   REVIEW OF SYSTEMS:  Not significant for any recent fever, cough, chest  pain, or diarrheal illness.   PHYSICAL EXAMINATION:  GENERAL:  A young African American lady who is at  present afebrile.  VITAL SIGNS:  Pulse rate 78 per minute and regular, blood pressure  128/86, respiratory rate 20 per minute, and sats 100%.  HEAD:  Nontraumatic.  NECK:  Supple.  There is no bruit.  ENT:  Unremarkable.  CARDIAC:  No murmur or gallop.  LUNGS:  Clear to auscultation.  ABDOMEN:  Soft and nontender.  NEUROLOGICAL:  The patient is pleasant, awake, alert, and cooperative.  There is no aphasia, apraxia, or dysarthria.  Pupils are equal and  reactive.  Eye movements have full range.  Face is symmetric.  Tongue is  midline.  Motor system exam reveals no upper extremity drift.  She has  symmetric strength, tone, reflexes, coordination, and sensation.  She  walks with a slow steady gait, including tandem walking.   CT  scan of the head is normal without any acute abnormality.   Electrolytes are normal.  UA is unremarkable.  Urine pregnancy is  negative.  White count is slightly elevated at 14,000.   IMPRESSION:  A 30 year old lady with 2 partial-onset seizures with  secondary generalization with prior history of epilepsy who has been  noncompliant with her medications.   PLAN:  I agree with Keppra, continue 500 twice a day.  I have counseled  the patient and her boyfriend regarding compliance with the medication  and to avoid seizure-provoking factors like sleep deprivation and  medication noncompliance.  The patient will benefit by having an EEG and  outpatient followup with Neurology for medication adjustment as needed.  I have advised the patient not to drive for the next 3-6 months as  mandatory by [facility] law and to avoid seizure-provoking factors.   Thank you for the referral.  I will be happy to follow the patient in  consult.  Kindly call for questions.           ______________________________  Sunny Schlein. Pearlean Brownie, MD     PPS/MEDQ  D:  06/27/2008  T:  06/28/2008  Job:  [id number]

## 2011-03-18 NOTE — Op Note (Signed)
Kristen Roberts, Kristen Roberts              ACCOUNT NO.:  [id number]   MEDICAL RECORD NO.:  [id number]          PATIENT TYPE:  AMB   LOCATION:  SDC                           [facility]  [facility]   PHYSICIAN:  Roseanna Rainbow, M.D.DATE OF BIRTH:  May 01, 1981   DATE OF PROCEDURE:  07/13/2006  DATE OF DISCHARGE:                                 OPERATIVE REPORT   PREOPERATIVE DIAGNOSIS:  Multiparity, desires sterilization procedure.   POSTOPERATIVE DIAGNOSIS:  Multiparity, desires sterilization procedure.   PROCEDURE:  Laparoscopic bilateral tubal ligation with fulguration.   SURGEON:  Roseanna Rainbow, M.D.   ANESTHESIA:  General tracheal.   PATHOLOGY:  None.   ESTIMATED BLOOD LOSS:  Minimal.   COMPLICATIONS:  None.   PROCEDURE:  The patient was taken to the operating room with IV running.  She was given general anesthesia and placed in the dorsal lithotomy  position.  She was prepped and draped in the usual sterile fashion.  A  bivalve speculum was placed in the patient's vagina.  The anterior lip of  cervix was grasped with the single-tooth tenaculum.  Hulka manipulator was  then advanced into the uterus and secured to the anterior lip of the cervix  as a means to manipulate the uterus.  The single-tooth tenaculum and  speculum were then removed.  An infraumbilical skin incision was then made  with the scalpel.  The Veress needle was then introduced into the peritoneal  cavity while tenting up the anterior abdominal wall at a 45 degree angle.  Intra-abdominal placement was confirmed by saline drop test and an  appropriately low pressure reading upon insufflation of CO2 gas.  The  abdomen was insufflated with 3.5 liters of CO2.  The trocar and sleeve were  then advanced into the abdomen where intra-abdominal placement was confirmed  with the laparoscope.  Survey of the pelvic viscera was normal.  The right  fallopian tube was then visualized and followed out to the fimbriated end.  A 2 cm segment of the mid isthmic portion of the tube was then cauterized  contiguously using bipolar cautery.  With each application, the ohmmeter was  noted to go to 0.  The left fallopian tube was manipulated in a similar  fashion.  All instruments were removed from the abdomen.  The skin was  reapproximated with 3-0 Vicryl and Dermabond.  The Hulka manipulator was  removed from the vagina with minimal bleeding noted from the cervix.  At the  close of the procedure, the instrument and pack counts were said to be  correct x2.  The patient was taken to the PACU awake and in stable  condition.     Roseanna Rainbow, M.D.  Electronically Signed    LAJ/MEDQ  D:  07/13/2006  T:  07/14/2006  Job:  [id number]

## 2011-03-18 NOTE — H&P (Signed)
Kristen Roberts, HAROS              ACCOUNT NO.:  [id number]   MEDICAL RECORD NO.:  [id number]          PATIENT TYPE:  AMB   LOCATION:  SDC                           [facility]  [facility]   PHYSICIAN:  Roseanna Rainbow, M.D.DATE OF BIRTH:  [birth year]   DATE OF ADMISSION:  DATE OF DISCHARGE:                                HISTORY & PHYSICAL   CHIEF COMPLAINT:  The patient is a 30 year old multipara who desires a  sterilization procedure.   HISTORY OF PRESENT ILLNESS:  Please see the above.   PAST GYN HISTORY:  She has a history of a D&C.   PAST MEDICAL HISTORY:  No significant history of medical diseases.   SOCIAL HISTORY:  She denies any tobacco, ethanol, or drug use.   FAMILY HISTORY:  Adult-onset diabetes, heart disease.   ALLERGIES:  No known drug allergies.   MEDICATIONS:  Please see the medication reconciliation sheet.   PAST OB HISTORY:  She has a history of one voluntary termination of  pregnancy, and she has a history of two spontaneous vaginal deliveries.   PHYSICAL EXAMINATION:  VITAL SIGNS:  Stable.  Afebrile.  GENERAL:  Well-developed and well-nourished.  No apparent distress.  LUNGS:  Clear to auscultation bilaterally.  HEART:  Regular rate and rhythm.  ABDOMEN:  Soft, nontender.  No organomegaly.  PELVIC:  Normal EG/BUS.  Speculum exam is clean.  Bimanual exam:  The uterus  is small, anteverted, nontender.  The adnexa are nonpalpable, nontender.   ASSESSMENT:  Multipara, desires sterilization procedure.  The risks,  benefits and alternative forms of management were reviewed with the patient,  and informed consent has been obtained.  A failure rate of 4-8 per 1000  cases was reviewed with the patient as well as the subsequent increased risk  of an ectopic pregnancy.  The planned procedure is a laparoscopic bilateral  tubal ligation.      Roseanna Rainbow, M.D.  Electronically Signed    LAJ/MEDQ  D:  07/12/2006  T:  07/12/2006  Job:  [id number]

## 2011-07-26 LAB — CBC
HCT: 32.5 — ABNORMAL LOW
HCT: 33.3 — ABNORMAL LOW
Hemoglobin: 11 — ABNORMAL LOW
Hemoglobin: 11.2 — ABNORMAL LOW
Hemoglobin: 11.6 — ABNORMAL LOW
MCHC: 34.1
MCHC: 34.2
MCHC: 33.6
MCHC: 34.2
MCV: 91.4
MCV: 91.4
MCV: 91.4
MCV: 91.5
MCV: 91.7
Platelets: 368
Platelets: 391
Platelets: 409 — ABNORMAL HIGH
RBC: 3.55 — ABNORMAL LOW
RBC: 3.64 — ABNORMAL LOW
RBC: 3.68 — ABNORMAL LOW
RBC: 3.87
RBC: 4.16
RBC: 3.6 — ABNORMAL LOW
RBC: 3.69 — ABNORMAL LOW
RDW: 13.5
RDW: 13.8
RDW: 13.8
WBC: 12.1 — ABNORMAL HIGH
WBC: 14.6 — ABNORMAL HIGH
WBC: 16.1 — ABNORMAL HIGH
WBC: 13.1 — ABNORMAL HIGH

## 2011-07-26 LAB — POCT I-STAT, CHEM 8
Creatinine, Ser: 1
Glucose, Bld: 87
HCT: 41
Hemoglobin: 13.9
TCO2: 25

## 2011-07-26 LAB — URINE MICROSCOPIC-ADD ON

## 2011-07-26 LAB — COMPREHENSIVE METABOLIC PANEL
AST: 17
Albumin: 3.2 — ABNORMAL LOW
Alkaline Phosphatase: 79
CO2: 26
Chloride: 105
Creatinine, Ser: 0.7
GFR calc Af Amer: >60
GFR calc non Af Amer: >60
Potassium: 3.2 — ABNORMAL LOW
Total Bilirubin: 0.4

## 2011-07-26 LAB — URINALYSIS, ROUTINE W REFLEX MICROSCOPIC
Glucose, UA: NEGATIVE
Protein, ur: NEGATIVE
Specific Gravity, Urine: 1.016
pH: 6

## 2011-07-26 LAB — DIFFERENTIAL
Basophils Absolute: 0
Basophils Relative: 0
Eosinophils Absolute: 0.2
Monocytes Absolute: 0.2
Monocytes Relative: 1 — ABNORMAL LOW
Neutro Abs: 15 — ABNORMAL HIGH
Neutrophils Relative %: 78 — ABNORMAL HIGH

## 2011-07-26 LAB — ABO/RH: ABO/RH(D): B POS

## 2011-07-26 LAB — HCG, QUANTITATIVE, PREGNANCY
hCG, Beta Chain, Quant, S: 111 — ABNORMAL HIGH
hCG, Beta Chain, Quant, S: 117 — ABNORMAL HIGH

## 2011-07-26 LAB — WET PREP, GENITAL

## 2011-07-28 LAB — DIFFERENTIAL
Basophils Relative: 0
Eosinophils Absolute: 0.3
Monocytes Absolute: 0.9
Monocytes Relative: 5
Neutro Abs: 12.3 — ABNORMAL HIGH

## 2011-07-28 LAB — POCT I-STAT, CHEM 8
Calcium, Ion: 1.1 — ABNORMAL LOW
Chloride: 106
Creatinine, Ser: 1
Glucose, Bld: 69 — ABNORMAL LOW
HCT: 42

## 2011-07-28 LAB — CBC
Hemoglobin: 13.3
MCHC: 34.4
MCV: 92
RBC: 4.18

## 2011-07-28 LAB — POCT PREGNANCY, URINE
Operator id: 257131
Preg Test, Ur: NEGATIVE

## 2013-11-06 ENCOUNTER — Emergency Department (HOSPITAL_COMMUNITY)
Admission: EM | Admit: 2013-11-06 | Discharge: 2013-11-07 | Disposition: A | Discharge status: Home / Self Care | Payer: Medicaid Other | Attending: Emergency Medicine | Admitting: Emergency Medicine

## 2013-11-06 ENCOUNTER — Encounter (HOSPITAL_COMMUNITY): Payer: Self-pay | Admitting: Emergency Medicine

## 2013-11-06 DIAGNOSIS — K089 Disorder of teeth and supporting structures, unspecified: Secondary | ICD-10-CM | POA: Insufficient documentation

## 2013-11-06 DIAGNOSIS — F17200 Nicotine dependence, unspecified, uncomplicated: Secondary | ICD-10-CM | POA: Insufficient documentation

## 2013-11-06 DIAGNOSIS — K0889 Other specified disorders of teeth and supporting structures: Secondary | ICD-10-CM

## 2013-11-06 DIAGNOSIS — Z791 Long term (current) use of non-steroidal anti-inflammatories (NSAID): Secondary | ICD-10-CM | POA: Insufficient documentation

## 2013-11-06 NOTE — ED Notes (Signed)
Broke tooth on the top left side.

## 2013-11-07 MED ORDER — HYDROCODONE-ACETAMINOPHEN 5-325 MG PO TABS: 1.0000 | ORAL_TABLET | ORAL | Status: DC | PRN

## 2013-11-07 MED ORDER — PENICILLIN V POTASSIUM 500 MG PO TABS: 500.0000 mg | ORAL_TABLET | Freq: Three times a day (TID) | ORAL | Status: DC

## 2013-11-07 NOTE — ED Provider Notes (Signed)
CSN: [id number]     Arrival date & time 11/06/13  2247 History   First MD Initiated Contact with Patient 11/06/13 2344     Chief Complaint  Patient presents with  . Dental Pain   (Consider location/radiation/quality/duration/timing/severity/associated sxs/prior Treatment) HPI History provided by pt.   Pt presents w/ L upper dental pain since biting into a piece of chicken this afternoon.  Pain severe and non-radiating.  No associated sx.  Took naproxen w/out relief.  History reviewed. No pertinent past medical history. Past Surgical History  Procedure Laterality Date  . Ectopic pregnancy surgery     History reviewed. No pertinent family history. History  Substance Use Topics  . Smoking status: Current Some Day Smoker  . Smokeless tobacco: Not on file  . Alcohol Use: Yes     Comment: ocassionally   OB History   Grav Para Term Preterm Abortions TAB SAB Ect Mult Living                 Review of Systems  All other systems reviewed and are negative.    Allergies  Levetiracetam  Home Medications   Current Outpatient Rx  Name  Route  Sig  Dispense  Refill  . naproxen sodium (ANAPROX) 220 MG tablet   Oral   Take 220 mg by mouth 2 (two) times daily with a meal.         . HYDROcodone-acetaminophen (NORCO/VICODIN) 5-325 MG per tablet   Oral   Take 1 tablet by mouth every 4 (four) hours as needed for moderate pain.   20 tablet   0   . penicillin v potassium (VEETID) 500 MG tablet   Oral   Take 1 tablet (500 mg total) by mouth 3 (three) times daily.   30 tablet   0    BP 126/75  Pulse 94  Temp(Src) 98.1 F (36.7 C) (Oral)  Resp 16  Ht 5\' 5"  (1.651 m)  Wt 187 lb 11.2 oz (85.14 kg)  BMI 31.23 kg/m2  SpO2 100%  LMP 10/21/2013 Physical Exam  Nursing note and vitals reviewed. Constitutional: She is oriented to person, place, and time. She appears well-developed and well-nourished. No distress.  HENT:  Head: Normocephalic and atraumatic.  L upper 1st molar w/  Rennis HardingEllis type 1 fracture on posterior surface.  Severely ttp w/ guarding.  Adjacent gingiva edematous.     Eyes:  Normal appearance  Neck: Normal range of motion.  Pulmonary/Chest: Effort normal.  Musculoskeletal: Normal range of motion.  Neurological: She is alert and oriented to person, place, and time.  Psychiatric: She has a normal mood and affect. Her behavior is normal.    ED Course  Dental Date/Time: 11/07/2013 12:52 AM Performed by: Otilio MiuSCHINLEVER, Bevin Mayall E Authorized by: Ruby ColaSCHINLEVER, Lavida Patch E Consent: Verbal consent obtained. Risks and benefits: risks, benefits and alternatives were discussed Consent given by: patient Patient understanding: patient states understanding of the procedure being performed Local anesthesia used: yes Anesthesia: nerve block Local anesthetic: bupivacaine 0.5% with epinephrine Anesthetic total (ml): 1.8. Patient sedated: no Patient tolerance: Patient tolerated the procedure well with no immediate complications. Comments: Periapical block at L upper 1st molar   (including critical care time) Labs Review Labs Reviewed - No data to display Imaging Review No results found.  EKG Interpretation   None       MDM   1. Toothache    32yo healthy F presents w/ L upper 1st molar fracture.  Has had pain in this tooth recently.  Local anesthesia administered w/ relief.  Pt d/c'd home w/ pencillin and vicodin.  Referred to dentist on call.  Return precautions discussed.      Otilio Miu, PA-C 11/07/13 [phone]

## 2013-11-07 NOTE — Discharge Instructions (Signed)
Take antibiotic as prescribed. Take vicodin as prescribed for severe pain.   Do not drive within four hours of taking this medication (may cause drowsiness or confusion).  Take ibuprofen w/ food up to three times a day, as well.  Follow up with a dentist as soon as possible.  If you contact the dentist you have been referred to within 48 hours, your follow up appointment will be considered part of today's ER visit.  You should return to the ER if you develop worsening symptoms or facial swelling.   °

## 2013-11-08 NOTE — ED Provider Notes (Signed)
Medical screening examination/treatment/procedure(s) were performed by non-physician practitioner and as supervising physician I was immediately available for consultation/collaboration.  EKG Interpretation   None         Yuvan Medinger, MD 11/08/13 0735 

## 2014-01-29 ENCOUNTER — Encounter (HOSPITAL_COMMUNITY): Payer: Self-pay | Admitting: Emergency Medicine

## 2014-01-29 ENCOUNTER — Emergency Department (HOSPITAL_COMMUNITY)
Admission: EM | Admit: 2014-01-29 | Discharge: 2014-01-29 | Disposition: A | Discharge status: Home / Self Care | Payer: Medicaid Other | Attending: Emergency Medicine | Admitting: Emergency Medicine

## 2014-01-29 DIAGNOSIS — F17200 Nicotine dependence, unspecified, uncomplicated: Secondary | ICD-10-CM | POA: Insufficient documentation

## 2014-01-29 DIAGNOSIS — M79609 Pain in unspecified limb: Secondary | ICD-10-CM | POA: Insufficient documentation

## 2014-01-29 DIAGNOSIS — M79604 Pain in right leg: Secondary | ICD-10-CM

## 2014-01-29 HISTORY — DX: Unspecified convulsions: R56.9

## 2014-01-29 MED ORDER — IBUPROFEN 800 MG PO TABS: 800.0000 mg | ORAL_TABLET | Freq: Three times a day (TID) | ORAL | Status: DC

## 2014-01-29 MED ORDER — METHOCARBAMOL 500 MG PO TABS: 500.0000 mg | ORAL_TABLET | Freq: Two times a day (BID) | ORAL | Status: DC

## 2014-01-29 MED ORDER — ENOXAPARIN SODIUM 150 MG/ML ~~LOC~~ SOLN
1.0000 mg/kg | Freq: Once | SUBCUTANEOUS | Status: AC
  Administered 2014-01-29: 80 mg via SUBCUTANEOUS
  Filled 2014-01-29 (×2): qty 1

## 2014-01-29 MED ORDER — ENOXAPARIN SODIUM 80 MG/0.8ML ~~LOC~~ SOLN
80.0000 mg | Freq: Once | SUBCUTANEOUS | Status: DC
  Filled 2014-01-29: qty 0.8

## 2014-01-29 NOTE — ED Provider Notes (Signed)
CSN: [id number]     Arrival date & time 01/29/14  2115 History  This chart was scribed for non-physician practitioner, Fayrene Helper, PA-C working with Merrie Roof, * by Luisa Dago, ED scribe. This patient was seen in room TR05C/TR05C and the patient's care was started at 9:48 PM.     Chief Complaint  Patient presents with  . Leg Pain   The history is provided by the patient. No language interpreter was used.   HPI Comments: Kristen Roberts is a 33 y.o. Female with history of seizures, presents to the Emergency Department complaining of worsening right leg pain that started 1 day ago. 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 She says that yesterday she wore the "wrong pair of shoes" to work which caused her to experience a sharp shooting pain to her left foot. She states that this morning upon waking she noticed that the pain had moved from her right foot up to her right calf. She reports soaking her foot in some Epson salt with no relief. Pt denies any history of DVT. She also denies any long distance travels and recent surgeries. She denies any other pertinent medical history. Denies any fever, chills, diaphoresis, abdominal pain, nausea, or emesis.   Pt does not have a PCP  Past Medical History  Diagnosis Date  . Seizures 2007    No longer taking medications. Last instance was with pregnancy.    Past Surgical History  Procedure Laterality Date  . Ectopic pregnancy surgery     History reviewed. No pertinent family history. History  Substance Use Topics  . Smoking status: Current Some Day Smoker  . Smokeless tobacco: Not on file  . Alcohol Use: Yes     Comment: ocassionally   OB History   Grav Para Term Preterm Abortions TAB SAB Ect Mult Living                 Review of Systems  Constitutional: Negative for fever, chills and diaphoresis.  HENT: Negative for congestion.   Respiratory: Negative for cough and  shortness of breath.   Cardiovascular: Negative for chest pain.  Gastrointestinal: Negative for nausea, vomiting and abdominal pain.  Musculoskeletal: Positive for arthralgias (right leg).      Allergies  Levetiracetam  Home Medications  No current outpatient prescriptions on file.  Triage Vitals: BP 118/78  Pulse 97  Temp(Src) 99.1 F (37.3 C) (Oral)  Resp 18  Ht 5\' 5"  (1.651 m)  Wt 178 lb 4 oz (80.854 kg)  BMI 29.66 kg/m2  SpO2 98%  LMP 01/29/2014  Physical Exam  Nursing note and vitals reviewed. Constitutional: She appears well-developed and well-nourished. No distress.  HENT:  Head: Normocephalic and atraumatic.  Eyes: Conjunctivae are normal. Right eye exhibits no discharge. Left eye exhibits no discharge.  Neck: Neck supple.  Cardiovascular: Normal rate, regular rhythm and normal heart sounds.  Exam reveals no gallop and no friction rub.   No murmur heard. Pulmonary/Chest: Effort normal and breath sounds normal. No respiratory distress.  Abdominal: Soft. She exhibits no distension. There is no tenderness.  Musculoskeletal: She exhibits no edema and no tenderness.  Right lower leg: tenderness noted to right calf region with palpation. No obvious skin changes noted. Bilateral lower extremities with palpable couse. No edema. No erythema. Dorsalis and posterior tibialis is intact.  Neurological: She is alert.  Skin: Skin is warm and dry.  Psychiatric: She  has a normal mood and affect. Her behavior is normal. Thought content normal.    ED Course  Procedures (including critical care time)  DIAGNOSTIC STUDIES: Oxygen Saturation is 98% on RA, normal by my interpretation.    COORDINATION OF CARE: 9:57 PM- Pt has Right lower leg pain, likely MSK.  However patient concern of possible DVT although having no risk factors  No evidence of infection.  Is NVI.  Will give pt a referral for an ultrasound tomorrow.  lovenox prophylactic given. Pt has normal renal function and no  reported abnormal bleeding.  Care discussed with Dr. Loretha StaplerWofford. Pt advised of plan for treatment and pt agrees. Medications  enoxaparin (LOVENOX) injection 80 mg (not administered)   Labs Review Labs Reviewed - No data to display Imaging Review No results found.   EKG Interpretation None      MDM   Final diagnoses:  Right leg pain    BP 118/78  Pulse 97  Temp(Src) 99.1 F (37.3 C) (Oral)  Resp 18  Ht 5\' 5"  (1.651 m)  Wt 178 lb 4 oz (80.854 kg)  BMI 29.66 kg/m2  SpO2 98%  LMP 01/29/2014   I personally performed the services described in this documentation, which was scribed in my presence. The recorded information has been reviewed and is accurate.     Fayrene HelperBowie Mandrell Vangilder, PA-C 01/29/14 2307

## 2014-01-29 NOTE — ED Notes (Signed)
PA at bedside.

## 2014-01-29 NOTE — ED Notes (Addendum)
Pt having pain starting in foot then traveling up to calf then behind knee lower thigh throughout the day. No swelling or redness noted; Pt is smoker but does not take BCPs. No respiratory distress. Pt placed in gown

## 2014-01-29 NOTE — ED Notes (Addendum)
Patient arrives from home with complaint of lower right leg pain. Pain started yesterday morning in right foot and has progressively worsened and traveled up the leg and patient now feels pain has high as mid hamstring. 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 Patient was working when pain was first noticed.

## 2014-01-29 NOTE — Discharge Instructions (Signed)
[facility] for vascular doppler ultrasound to rule out DVT.

## 2014-01-29 NOTE — ED Notes (Signed)
Pharmacy called for lovenox 

## 2014-01-31 NOTE — ED Provider Notes (Signed)
Medical screening examination/treatment/procedure(s) were performed by non-physician practitioner and as supervising physician I was immediately available for consultation/collaboration.   EKG Interpretation None        Kristen ChurnJohn David Isadore Bokhari III, MD 01/31/14 [phone]

## 2014-08-09 ENCOUNTER — Emergency Department (HOSPITAL_COMMUNITY): Payer: No Typology Code available for payment source

## 2014-08-09 ENCOUNTER — Emergency Department (HOSPITAL_COMMUNITY)
Admission: EM | Admit: 2014-08-09 | Discharge: 2014-08-09 | Disposition: A | Discharge status: Home / Self Care | Payer: No Typology Code available for payment source | Attending: Emergency Medicine | Admitting: Emergency Medicine

## 2014-08-09 ENCOUNTER — Encounter (HOSPITAL_COMMUNITY): Payer: Self-pay | Admitting: Emergency Medicine

## 2014-08-09 DIAGNOSIS — S20212A Contusion of left front wall of thorax, initial encounter: Secondary | ICD-10-CM | POA: Diagnosis not present

## 2014-08-09 DIAGNOSIS — S40812A Abrasion of left upper arm, initial encounter: Secondary | ICD-10-CM | POA: Diagnosis not present

## 2014-08-09 DIAGNOSIS — Y9389 Activity, other specified: Secondary | ICD-10-CM | POA: Diagnosis not present

## 2014-08-09 DIAGNOSIS — Y92410 Unspecified street and highway as the place of occurrence of the external cause: Secondary | ICD-10-CM | POA: Diagnosis not present

## 2014-08-09 DIAGNOSIS — S40022A Contusion of left upper arm, initial encounter: Secondary | ICD-10-CM | POA: Insufficient documentation

## 2014-08-09 DIAGNOSIS — Z720 Tobacco use: Secondary | ICD-10-CM | POA: Diagnosis not present

## 2014-08-09 DIAGNOSIS — S299XXA Unspecified injury of thorax, initial encounter: Secondary | ICD-10-CM | POA: Diagnosis present

## 2014-08-09 MED ORDER — HYDROCODONE-ACETAMINOPHEN 5-325 MG PO TABS
1.0000 | ORAL_TABLET | Freq: Once | ORAL | Status: AC
  Administered 2014-08-09: 1 via ORAL
  Filled 2014-08-09: qty 1

## 2014-08-09 MED ORDER — ONDANSETRON 4 MG PO TBDP
4.0000 mg | ORAL_TABLET | Freq: Once | ORAL | Status: AC
  Administered 2014-08-09: 4 mg via ORAL
  Filled 2014-08-09: qty 1

## 2014-08-09 MED ORDER — HYDROCODONE-ACETAMINOPHEN 5-325 MG PO TABS: 1.0000 | ORAL_TABLET | ORAL | Status: DC | PRN

## 2014-08-09 MED ORDER — NAPROXEN 500 MG PO TABS: 500.0000 mg | ORAL_TABLET | Freq: Two times a day (BID) | ORAL | Status: DC

## 2014-08-09 MED ORDER — CYCLOBENZAPRINE HCL 10 MG PO TABS: 10.0000 mg | ORAL_TABLET | Freq: Two times a day (BID) | ORAL | Status: DC | PRN

## 2014-08-09 NOTE — ED Provider Notes (Signed)
Medical screening examination/treatment/procedure(s) were performed by non-physician practitioner and as supervising physician I was immediately available for consultation/collaboration.   EKG Interpretation None       Ellesse Antenucci, MD 08/09/14 1645 
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
## 2014-08-09 NOTE — ED Notes (Signed)
Declined W/C at D/C and was escorted to lobby by RN. 

## 2015-06-04 ENCOUNTER — Emergency Department (HOSPITAL_COMMUNITY): Payer: Medicaid Other

## 2015-06-04 ENCOUNTER — Encounter (HOSPITAL_COMMUNITY): Payer: Self-pay | Admitting: Emergency Medicine

## 2015-06-04 ENCOUNTER — Emergency Department (HOSPITAL_COMMUNITY)
Admission: EM | Admit: 2015-06-04 | Discharge: 2015-06-05 | Disposition: A | Discharge status: Home / Self Care | Payer: Self-pay | Attending: Emergency Medicine | Admitting: Emergency Medicine

## 2015-06-04 DIAGNOSIS — R000 Tachycardia, unspecified: Secondary | ICD-10-CM | POA: Insufficient documentation

## 2015-06-04 DIAGNOSIS — K029 Dental caries, unspecified: Secondary | ICD-10-CM | POA: Insufficient documentation

## 2015-06-04 DIAGNOSIS — K002 Abnormalities of size and form of teeth: Secondary | ICD-10-CM | POA: Insufficient documentation

## 2015-06-04 DIAGNOSIS — Z3202 Encounter for pregnancy test, result negative: Secondary | ICD-10-CM | POA: Insufficient documentation

## 2015-06-04 DIAGNOSIS — R509 Fever, unspecified: Secondary | ICD-10-CM | POA: Insufficient documentation

## 2015-06-04 DIAGNOSIS — R112 Nausea with vomiting, unspecified: Secondary | ICD-10-CM | POA: Insufficient documentation

## 2015-06-04 DIAGNOSIS — R630 Anorexia: Secondary | ICD-10-CM | POA: Insufficient documentation

## 2015-06-04 DIAGNOSIS — K0381 Cracked tooth: Secondary | ICD-10-CM | POA: Insufficient documentation

## 2015-06-04 DIAGNOSIS — K047 Periapical abscess without sinus: Secondary | ICD-10-CM | POA: Insufficient documentation

## 2015-06-04 DIAGNOSIS — Z720 Tobacco use: Secondary | ICD-10-CM | POA: Insufficient documentation

## 2015-06-04 DIAGNOSIS — J029 Acute pharyngitis, unspecified: Secondary | ICD-10-CM | POA: Insufficient documentation

## 2015-06-04 LAB — URINE MICROSCOPIC-ADD ON

## 2015-06-04 LAB — COMPREHENSIVE METABOLIC PANEL
ALK PHOS: 73 U/L (ref 38–126)
ALT: 10 U/L — ABNORMAL LOW (ref 14–54)
AST: 27 U/L (ref 15–41)
Albumin: 3.4 g/dL — ABNORMAL LOW (ref 3.5–5.0)
Anion gap: 8 (ref 5–15)
BUN: <5 mg/dL — ABNORMAL LOW (ref 6–20)
CALCIUM: 8.6 mg/dL — AB (ref 8.9–10.3)
CO2: 25 mmol/L (ref 22–32)
Chloride: 102 mmol/L (ref 101–111)
Creatinine, Ser: 0.83 mg/dL (ref 0.44–1.00)
GFR calc Af Amer: >60 mL/min (ref >60)
GFR calc non Af Amer: >60 mL/min (ref >60)
GLUCOSE: 98 mg/dL (ref 65–99)
Potassium: 4.1 mmol/L (ref 3.5–5.1)
SODIUM: 135 mmol/L (ref 135–145)
TOTAL PROTEIN: 6.1 g/dL — AB (ref 6.5–8.1)
Total Bilirubin: 1.2 mg/dL (ref 0.3–1.2)

## 2015-06-04 LAB — CBC WITH DIFFERENTIAL/PLATELET
BASOS ABS: 0 10*3/uL (ref 0.0–0.1)
BASOS PCT: 0 % (ref 0–1)
EOS ABS: 0.1 10*3/uL (ref 0.0–0.7)
EOS PCT: 1 % (ref 0–5)
HCT: 39.8 % (ref 36.0–46.0)
HEMOGLOBIN: 13.4 g/dL (ref 12.0–15.0)
LYMPHS ABS: 2.2 10*3/uL (ref 0.7–4.0)
LYMPHS PCT: 10 % — AB (ref 12–46)
MCH: 30.2 pg (ref 26.0–34.0)
MCHC: 33.7 g/dL (ref 30.0–36.0)
MCV: 89.8 fL (ref 78.0–100.0)
MONO ABS: 2 10*3/uL — AB (ref 0.1–1.0)
MONOS PCT: 9 % (ref 3–12)
NEUTROS ABS: 18.6 10*3/uL — AB (ref 1.7–7.7)
Neutrophils Relative %: 80 % — ABNORMAL HIGH (ref 43–77)
Platelets: 391 10*3/uL (ref 150–400)
RBC: 4.43 MIL/uL (ref 3.87–5.11)
RDW: 13.2 % (ref 11.5–15.5)
WBC: 22.9 10*3/uL — ABNORMAL HIGH (ref 4.0–10.5)

## 2015-06-04 LAB — LIPASE, BLOOD: LIPASE: 15 U/L — AB (ref 22–51)

## 2015-06-04 LAB — URINALYSIS, ROUTINE W REFLEX MICROSCOPIC
BILIRUBIN URINE: NEGATIVE
Glucose, UA: NEGATIVE mg/dL
Ketones, ur: NEGATIVE mg/dL
Leukocytes, UA: NEGATIVE
Nitrite: NEGATIVE
PH: 7.5 (ref 5.0–8.0)
Protein, ur: NEGATIVE mg/dL
SPECIFIC GRAVITY, URINE: 1.007 (ref 1.005–1.030)
Urobilinogen, UA: 0.2 mg/dL (ref 0.0–1.0)

## 2015-06-04 LAB — POC URINE PREG, ED: Preg Test, Ur: NEGATIVE

## 2015-06-04 LAB — I-STAT CG4 LACTIC ACID, ED: Lactic Acid, Venous: 0.74 mmol/L (ref 0.5–2.0)

## 2015-06-04 MED ORDER — CLINDAMYCIN PHOSPHATE 600 MG/50ML IV SOLN
600.0000 mg | Freq: Once | INTRAVENOUS | Status: AC
  Administered 2015-06-04: 600 mg via INTRAVENOUS
  Filled 2015-06-04: qty 50

## 2015-06-04 MED ORDER — IOHEXOL 300 MG/ML  SOLN
75.0000 mL | Freq: Once | INTRAMUSCULAR | Status: AC | PRN
  Administered 2015-06-04: 75 mL via INTRAVENOUS

## 2015-06-04 MED ORDER — SODIUM CHLORIDE 0.9 % IV BOLUS (SEPSIS): 1000.0000 mL | Freq: Once | INTRAVENOUS | Status: DC

## 2015-06-04 MED ORDER — SODIUM CHLORIDE 0.9 % IV BOLUS (SEPSIS)
1000.0000 mL | Freq: Once | INTRAVENOUS | Status: AC
  Administered 2015-06-04: 1000 mL via INTRAVENOUS

## 2015-06-04 MED ORDER — VANCOMYCIN HCL IN DEXTROSE 1-5 GM/200ML-% IV SOLN: 1000.0000 mg | Freq: Once | INTRAVENOUS | Status: DC

## 2015-06-04 MED ORDER — PIPERACILLIN-TAZOBACTAM 3.375 G IVPB 30 MIN: 3.3750 g | Freq: Once | INTRAVENOUS | Status: DC

## 2015-06-04 MED ORDER — ACETAMINOPHEN 325 MG PO TABS
650.0000 mg | ORAL_TABLET | Freq: Once | ORAL | Status: DC
  Filled 2015-06-04: qty 2

## 2015-06-04 MED ORDER — MORPHINE SULFATE 4 MG/ML IJ SOLN
4.0000 mg | Freq: Once | INTRAMUSCULAR | Status: AC
  Administered 2015-06-04: 4 mg via INTRAVENOUS
  Filled 2015-06-04: qty 1

## 2015-06-04 MED ORDER — ONDANSETRON HCL 4 MG/2ML IJ SOLN
4.0000 mg | Freq: Once | INTRAMUSCULAR | Status: AC
  Administered 2015-06-04: 4 mg via INTRAVENOUS
  Filled 2015-06-04: qty 2

## 2015-06-04 MED ORDER — KETOROLAC TROMETHAMINE 15 MG/ML IJ SOLN
15.0000 mg | Freq: Once | INTRAMUSCULAR | Status: AC
  Administered 2015-06-04: 15 mg via INTRAVENOUS
  Filled 2015-06-04: qty 1

## 2015-06-04 MED ORDER — SODIUM CHLORIDE 0.9 % IV BOLUS (SEPSIS)
500.0000 mL | INTRAVENOUS | Status: DC
  Administered 2015-06-04: 500 mL via INTRAVENOUS

## 2015-06-04 MED ORDER — SODIUM CHLORIDE 0.9 % IV BOLUS (SEPSIS)
1000.0000 mL | INTRAVENOUS | Status: DC
  Administered 2015-06-04: 1000 mL via INTRAVENOUS

## 2015-06-04 NOTE — ED Notes (Signed)
Pt arrives with dental pain ongoing since yesterday. Pt reports left sided upper and lower pain. States swelling to area, states she couldn't sleep because of it. Ibuprofen not effective.

## 2015-06-04 NOTE — ED Notes (Signed)
Dr Juleen China state to hold off on zosyn and vanc and that he will put in order for clindamycin

## 2015-06-04 NOTE — ED Notes (Signed)
PA-C Tapia notified of SIRS criteria including tachycardia and fever. Nauseated.

## 2015-06-04 NOTE — ED Provider Notes (Signed)
CSN: [id number]     Arrival date & time 06/04/15  2018 History  This chart was scribed for Danelle Berry, PA-C, working with Laurence Spates, MD by Chestine Spore, ED Scribe. The patient was seen in room TR05C/TR05C at 8:36 PM.    Chief Complaint  Patient presents with  . Dental Pain      The history is provided by the patient. No language interpreter was used.    Kristen Roberts is a 34 y.o. female who presents to the Emergency Department complaining of left sided upper and lower dental pain onset yesterday. Pt thinks that she broke another portion of her tooth yesterday. Pt notes that she is unable to sleep due to the dental pain. Pt spoke with her caseworker to get her medicaid card sent quick, but it will not come for another 10 days. She states that she is having associated symptoms of left sided facial swelling, fever, vomiting, appetite change, voice change, difficulty breathing while laying down, sore throat, and heart racing sensation x 2 days. Pt has been drinking chicken broth and trying to keep it down, otherwise she is not able to keep solids or liquids down. Pt has been trying to stay in the [facility] in order to cool off. She states that she has tried ibuprofen with no relief for her symptoms. She denies gum swelling/bleeding, fever, chills, trouble swallowing, abdominal pain, and any other symptoms. Pt denies sick contacts. She further denies any other symptoms such as shortness of breath, cough, cold-like symptoms, chest pain, dysuria, and she denies any illegal drug use or injected drugs, she denies any headache.    Past Medical History  Diagnosis Date  . Seizures 2007    No longer taking medications. Last instance was with pregnancy.    Past Surgical History  Procedure Laterality Date  . Ectopic pregnancy surgery     No family history on file. History  Substance Use Topics  . Smoking status: Current Some Day Smoker  . Smokeless tobacco: Not on file  . Alcohol Use: Yes      Comment: ocassionally   OB History    No data available     Review of Systems  Constitutional: Positive for fever and appetite change.  HENT: Positive for dental problem, facial swelling, sore throat and voice change. Negative for trouble swallowing.        Left sided upper and lower lip swelling  Respiratory: Negative for wheezing.   Gastrointestinal: Positive for nausea and vomiting. Negative for abdominal pain.    Allergies  Levetiracetam  Home Medications   Prior to Admission medications   Medication Sig Start Date End Date Taking? Authorizing Provider  ibuprofen (ADVIL,MOTRIN) 200 MG tablet Take 800 mg by mouth every 6 (six) hours as needed for moderate pain.   Yes Historical Provider, MD  naproxen sodium (ANAPROX) 220 MG tablet Take 440 mg by mouth daily as needed (dental pain).   Yes Historical Provider, MD  ibuprofen (ADVIL,MOTRIN) 600 MG tablet Take 1 tablet (600 mg total) by mouth every 6 (six) hours as needed. 06/05/15   Raeford Razor, MD  oxyCODONE-acetaminophen (PERCOCET/ROXICET) 5-325 MG per tablet Take 1-2 tablets by mouth every 4 (four) hours as needed for severe pain. 06/05/15   Raeford Razor, MD  penicillin v potassium (VEETID) 500 MG tablet Take 1 tablet (500 mg total) by mouth 4 (four) times daily. 06/05/15 06/12/15  Raeford Razor, MD   BP 114/63 mmHg  Pulse 99  Temp(Src) 99.8 F (37.7  C) (Oral)  Resp 24  Ht 5' 5.5" (1.664 m)  Wt 181 lb (82.101 kg)  BMI 29.65 kg/m2  SpO2 99%  LMP 05/29/2015 (Exact Date) Physical Exam  Constitutional: She is oriented to person, place, and time. She appears well-developed and well-nourished. No distress.  Well-developed, well-nourished female appears stated age, appears in pain, nontoxic appearing  HENT:  Head: Normocephalic and atraumatic.    Right Ear: Tympanic membrane, external ear and ear canal normal.  Left Ear: Tympanic membrane, external ear and ear canal normal.  Nose: Nose normal. No mucosal edema or rhinorrhea.   Mouth/Throat: Uvula is midline. Mucous membranes are not pale, not dry and not cyanotic. Oral lesions present. No trismus in the jaw. Abnormal dentition. Dental caries present. No dental abscesses, uvula swelling or lacerations. Posterior oropharyngeal erythema present. No oropharyngeal exudate, posterior oropharyngeal edema or tonsillar abscesses.    Diffuse oral decay, diffuse erythema and multiple lesions with hypertrophic gumline, tender to palpation to left lower jaw and left upper jaw gingival mucosa and to left side cheek buccal mucosa - no noted area of fluctuance, no sublingual tenderness to palpation  Eyes: Conjunctivae and EOM are normal. Pupils are equal, round, and reactive to light. Right eye exhibits no discharge. Left eye exhibits no discharge. No scleral icterus.  Neck: Normal range of motion, full passive range of motion without pain and phonation normal. Neck supple. No JVD present. No tracheal tenderness, no spinous process tenderness and no muscular tenderness present. No rigidity. No tracheal deviation, no edema, no erythema and normal range of motion present. No thyromegaly present.    Cardiovascular: Regular rhythm, normal heart sounds and intact distal pulses.  Tachycardia present.  Exam reveals no gallop and no friction rub.   No murmur heard. Pulmonary/Chest: Effort normal and breath sounds normal. No accessory muscle usage or stridor. No tachypnea. No respiratory distress. She has no decreased breath sounds. She has no wheezes. She has no rhonchi. She has no rales. She exhibits no tenderness.  Abdominal: Soft. Normal appearance and bowel sounds are normal. She exhibits no distension and no mass. There is no tenderness. There is no rigidity, no rebound, no guarding and no CVA tenderness.  Musculoskeletal: Normal range of motion. She exhibits no edema or tenderness.  Lymphadenopathy:    She has no cervical adenopathy.  Neurological: She is alert and oriented to person,  place, and time. She has normal reflexes. No cranial nerve deficit. She exhibits normal muscle tone. Coordination normal.  Skin: Skin is warm and dry. No rash noted. She is not diaphoretic. No erythema. No pallor.  Psychiatric: She has a normal mood and affect. Her speech is normal and behavior is normal. Judgment and thought content normal. Cognition and memory are normal.  Nursing note and vitals reviewed.   ED Course  Procedures (including critical care time) DIAGNOSTIC STUDIES: Oxygen Saturation is 99% on RA, nl by my interpretation.    COORDINATION OF CARE: 8:44 PM-Discussed treatment plan which includes tylenol and zofran with pt at bedside and pt agreed to plan.   Labs Review Labs Reviewed  COMPREHENSIVE METABOLIC PANEL - Abnormal; Notable for the following:    BUN <5 (*)    Calcium 8.6 (*)    Total Protein 6.1 (*)    Albumin 3.4 (*)    ALT 10 (*)    All other components within normal limits  LIPASE, BLOOD - Abnormal; Notable for the following:    Lipase 15 (*)    All other components  within normal limits  CBC WITH DIFFERENTIAL/PLATELET - Abnormal; Notable for the following:    WBC 22.9 (*)    Neutrophils Relative % 80 (*)    Neutro Abs 18.6 (*)    Lymphocytes Relative 10 (*)    Monocytes Absolute 2.0 (*)    All other components within normal limits  URINALYSIS, ROUTINE W REFLEX MICROSCOPIC (NOT AT [facility]) - Abnormal; Notable for the following:    Hgb urine dipstick TRACE (*)    All other components within normal limits  URINE MICROSCOPIC-ADD ON - Abnormal; Notable for the following:    Squamous Epithelial / LPF FEW (*)    All other components within normal limits  CULTURE, BLOOD (ROUTINE X 2)  CULTURE, BLOOD (ROUTINE X 2)  URINE CULTURE  I-STAT CG4 LACTIC ACID, ED  POC URINE PREG, ED  I-STAT CG4 LACTIC ACID, ED    Imaging Review Ct Maxillofacial W/cm  06/05/2015   CLINICAL DATA:  Acute onset of left upper and lower dental pain. May have broken a piece of tooth.  Left-sided facial swelling, fever, vomiting and appetite change. Difficulty breathing. Initial encounter.  EXAM: CT MAXILLOFACIAL WITH CONTRAST  TECHNIQUE: Multidetector CT imaging of the maxillofacial structures was performed with intravenous contrast. Multiplanar CT image reconstructions were also generated. A small metallic BB was placed on the right temple in order to reliably differentiate right from left.  CONTRAST:  75mL OMNIPAQUE IOHEXOL 300 MG/ML  SOLN  COMPARISON:  CT of the maxillofacial structures performed 08/09/2014  FINDINGS: Soft tissue swelling is noted overlying the left maxilla and mandible. This most likely arises from a small periapical abscess at the root of the mostly absent left second maxillary premolar, with a thin 1.0 x 0.4 cm collection of fluid and trace air noted overlying the periapical abscess.  Multiple large dental caries are noted with regard to both maxillary and mandibular teeth, and there is chronic absence of several maxillary teeth, but no additional periapical abscesses are seen.  Mucosal thickening is noted at the left maxillary sinus. The remaining visualized paranasal sinuses and mastoid air cells are well-aerated. The visualized portions of the brain are unremarkable in appearance. The orbits are within limits.  The nasopharynx, oropharynx and hypopharynx are unremarkable. The valleculae and piriform sinuses are within normal limits. The proximal trachea is grossly unremarkable. The parotid and submandibular glands are relatively symmetric. Parapharyngeal fat planes are preserved. No definite cervical lymphadenopathy is seen.  IMPRESSION: 1. Soft tissue swelling overlying the left maxilla and mandible. This most likely arises from a small periapical abscess at the root of the mostly absent left second maxillary premolar, with a thin 1.0 x 0.4 cm collection of fluid and trace air noted overlying the periapical abscess, likely reflecting minimal soft tissue abscess. This  likely remains too small to drain. 2. Multiple large dental caries with regard to both maxillary and mandibular teeth, and chronic absence of several maxillary teeth. 3. Mucosal thickening at the left maxillary sinus.   Electronically Signed   By: Roanna Raider M.D.   On: 06/05/2015 00:05     EKG Interpretation None      MDM   Final diagnoses:  Dental abscess    Patient with dental pain to left upper jaw-vitals in fast track showed tachycardia to heart rate of 124 with oral temp of 101.5, pt had multiple vomiting episodes while in room.  Plan to get basic labs, give pain meds, fluids and IV Zofran, patient unable to tolerate PO's at this  time, will give oral tylenol for fever after she is able to keep something down.  Patient was given pain medicine, became disoriented, unable to answer questions appropriately. At this time family was present in the room, which had not been there previously when patient was in quite a bit of pain but not disoriented at all, and was quite articulate when giving history.  The patient was discussed with Dr. Frederick Peers, who evaluated the patient. Plan was to add on a CT maxillofacial with contrast, to evaluate for abscess.  At this time with clinical exam, I do not suspect any Ludwig's angina, she had no sublingual tenderness. The patient's phonation was normal, posterior oropharynx was normal, without any edema or shift of uvula, no asymmetry of tonsils, also no concern for peritonsillar abscess. Initial labs reveal leukocytosis with white blood cell count of 22.9, and sepsis protocol was initiated, and subsequently patient was transferred out of fast track.  Patient care was transferred to Dr. Juleen China, for continuation of care and follow-up on CT scan results. Danelle Berry, PA-C - [phone]  I personally performed the services described in this documentation, which was scribed in my presence. The recorded information has been reviewed and is accurate.    Danelle Berry, PA-C 06/05/15 0103  Laurence Spates, MD 06/05/15 [phone]

## 2015-06-05 MED ORDER — PENICILLIN V POTASSIUM 500 MG PO TABS: 500.0000 mg | ORAL_TABLET | Freq: Four times a day (QID) | ORAL | Status: AC

## 2015-06-05 MED ORDER — OXYCODONE-ACETAMINOPHEN 5-325 MG PO TABS: 1.0000 | ORAL_TABLET | ORAL | Status: DC | PRN

## 2015-06-05 MED ORDER — IBUPROFEN 600 MG PO TABS: 600.0000 mg | ORAL_TABLET | Freq: Four times a day (QID) | ORAL | Status: DC | PRN

## 2015-06-05 NOTE — Discharge Instructions (Signed)
Dental Abscess °A dental abscess is a collection of infected fluid (pus) from a bacterial infection in the inner part of the tooth (pulp). It usually occurs at the end of the tooth's root.  °CAUSES  °· Severe tooth decay. °· Trauma to the tooth that allows bacteria to enter into the pulp, such as a broken or chipped tooth. °SYMPTOMS  °· Severe pain in and around the infected tooth. °· Swelling and redness around the abscessed tooth or in the mouth or face. °· Tenderness. °· Pus drainage. °· Bad breath. °· Bitter taste in the mouth. °· Difficulty swallowing. °· Difficulty opening the mouth. °· Nausea. °· Vomiting. °· Chills. °· Swollen neck glands. °DIAGNOSIS  °· A medical and dental history will be taken. °· An examination will be performed by tapping on the abscessed tooth. °· X-rays may be taken of the tooth to identify the abscess. °TREATMENT °The goal of treatment is to eliminate the infection. You may be prescribed antibiotic medicine to stop the infection from spreading. A root canal may be performed to save the tooth. If the tooth cannot be saved, it may be pulled (extracted) and the abscess may be drained.  °HOME CARE INSTRUCTIONS °· Only take over-the-counter or prescription medicines for pain, fever, or discomfort as directed by your caregiver. °· Rinse your mouth (gargle) often with salt water (¼ tsp salt in 8 oz [250 ml] of warm water) to relieve pain or swelling. °· Do not drive after taking pain medicine (narcotics). °· Do not apply heat to the outside of your face. °· Return to your dentist for further treatment as directed. °SEEK MEDICAL CARE IF: °· Your pain is not helped by medicine. °· Your pain is getting worse instead of better. °SEEK IMMEDIATE MEDICAL CARE IF: °· You have a fever or persistent symptoms for more than 2-3 days. °· You have a fever and your symptoms suddenly get worse. °· You have chills or a very bad headache. °· You have problems breathing or swallowing. °· You have trouble  opening your mouth. °· You have swelling in the neck or around the eye. °Document Released: 10/17/2005 Document Revised: 07/11/2012 Document Reviewed: 01/25/2011 °ExitCare® Patient Information ©2015 [employer]. This information is not intended to replace advice given to you by your health care provider. Make sure you discuss any questions you have with your health care provider. ° ° °Emergency Department Resource Guide °1) Find a Doctor and Pay Out of Pocket °Although you won't have to find out who is covered by your insurance plan, it is a good idea to ask around and get recommendations. You will then need to call the office and see if the doctor you have chosen will accept you as a new patient and what types of options they offer for patients who are self-pay. Some doctors offer discounts or will set up payment plans for their patients who do not have insurance, but you will need to ask so you aren't surprised when you get to your appointment. ° °2) Contact Your Local Health Department °Not all health departments have doctors that can see patients for sick visits, but many do, so it is worth a call to see if yours does. If you don't know where your local health department is, you can check in your phone book. The CDC also has a tool to help you locate your state's health department, and many state websites also have listings of all of their local health departments. ° °3) Find a Walk-in   Clinic °If your illness is not likely to be very severe or complicated, you may want to try a walk in clinic. These are popping up all over the country in pharmacies, drugstores, and shopping centers. They're usually staffed by nurse practitioners or physician assistants that have been trained to treat common illnesses and complaints. They're usually fairly quick and inexpensive. However, if you have serious medical issues or chronic medical problems, these are probably not your best option. ° °No Primary Care Doctor: °- Call  [facility] at  832-8000 - they can help you locate a primary care doctor that  accepts your insurance, provides certain services, etc. °- Physician Referral Service- 1-800-533-3463 ° °Chronic Pain Problems: °Organization         Address  Phone   Notes  °[facility]  (336) 297-2271 Patients need to be referred by their primary care doctor.  ° °Medication Assistance: °Organization         Address  Phone   Notes  °[place] [facility] [address] °[place], [place] [address] (336) 641-8030 --Must be a resident of [place] °-- Must have NO insurance coverage whatsoever (no Medicaid/ Medicare, etc.) °-- The pt. MUST have a primary care doctor that directs their care regularly and follows them in the community °  °[facility]  (866) 331-1348   °[employer]  (888) 892-1162   ° °Agencies that provide inexpensive medical care: °Organization         Address  Phone   Notes  °[facility]  (336) 832-8035   °[facility]    (336) 832-7272   °[facility] [address] °[place], [place] [address] (336) 832-4777   °[facility] [address], °[place] (336) 271-4999   °[facility]    (336) 373-0678   °[facility]    (336) 272-1050   °[facility] ° [address], [place] Phone:  (336) 832-4444, Fax:  (336) 832-4440 Hours of Operation:  9 am - 6 pm, M-F.  Also accepts Medicaid/Medicare and self-pay.  °[facility] ° [address], [place] Phone: (336) 832-3150, Fax: (336) 832-3151. Hours of Operation:  8:30 am - 5:30 pm, M-F.  Also accepts Medicaid and self-pay.  °[facility] [place] [address], [place] Phone: (336) 878-6027   °[facility] [address], [place], [place] (336)723-1848, Ext. 123 Mondays & Thursdays: 7-9 AM.  First 15 patients are seen on a first come, first serve  basis. °  ° °Medicaid-accepting [place] Providers: ° °Organization         Address  Phone   Notes  °[facility] [address], [place] (336) 641-2100 Also accepts self-pay patients.  °[facility] [address], [place] ° (336) 856-9996   °[facility] [address], [place] (336) 288-8857   °[facility] [address] [place] [address], [place] (336) 299-7000   °Veita Bland [address],   ° (336) 373-1557 Only accepts [place] Access Medicaid patients after they have their name applied to their card.  ° °Self-Pay (no insurance) in [place]: ° °Organization         Address  Phone   Notes  °Sickle Cell Patients, [facility] [address],  (  336) 832-1970   °[facility] [address], [place] (336) 832-4400   °[facility] ° [address], [place] (336) 992-4800   °[facility]/Dr. Osei-Bonsu ° [address] [place] [address], [place] or [address], [place] (336) 841-8500 Phone number for both [place] and [place] locations is the same.  °[facility] [address], [place] (336) 299-0000   °[facility] [address] [place] [address], [place] or [address] (336) 852-7530 °(336) 878-2260   °[facility] [address], [place] (336) 350-1642, phone; (336) 294-5005, fax Sees patients 1st and 3rd Saturday of every month.  Must not qualify for public or private insurance (i.e. Medicaid, Medicare, [place] Health Choice, Veterans' Benefits) • Household income should be no more than 200% of the poverty level •The clinic cannot treat you if you are pregnant or think you are pregnant • Sexually transmitted diseases are not treated at the clinic.  ° ° °Dental Care: °Organization         Address  Phone  Notes  °[place] [address], [place] (336) 641-6152 Accepts children up to age 21 who are enrolled in Medicaid or [place] Health Choice; pregnant women with a Medicaid card; and children who have applied for Medicaid or [place] Health Choice, but were declined, whose parents can pay a reduced fee at time of service.  °[facility] [place]  [address], [place] (336) 641-7733 Accepts children up to age 21 who are enrolled in Medicaid or [place] Health Choice; pregnant women with a Medicaid card; and children who have applied for Medicaid or [place] Health Choice, but were declined, whose parents can pay a reduced fee at time of service.  °[facility] ° [address], [place] (336) 641-4533 Patients are seen by appointment only. Walk-ins are not accepted. [facility] will see patients 18 years of age and older. °Monday - Tuesday (8am-5pm) °Most Wednesdays (8:30-5pm) °$30 per visit, cash only  °[facility] ° [address], [place] (336) 641-4533 Patients are seen by appointment only. Walk-ins are not accepted. [facility] will see patients 18 years of age and older. °One Wednesday Evening (Monthly: Volunteer Based).  $30 per visit, cash only  °[facility]  (919) 537-3737 for adults; Children under age 4, call Graduate Pediatric Dentistry at (919) [phone]. Children aged 4-14, please call (919) [phone] to request a pediatric application. ° Dental services are provided in all areas of dental care including fillings, crowns and bridges, complete and partial dentures, implants, gum treatment, root canals, and extractions. Preventive care is also provided. Treatment is provided to both adults and children. °Patients are selected via a lottery and there is often a waiting list. °  °[facility] [address], °[place] ° (336) 763-8833  [email or web address] °  °[facility] [address], [place], [place] (336)723-1848, Ext. 123 Second and Fourth Thursday of each month, opens at 6:30 AM; Clinic ends at 9 AM.  Patients are seen on a first-come first-served basis, and a limited number are seen during each clinic.  ° °[facility] ° [address], [place],  (336) 723-7904   Eligibility Requirements °You must have lived in [place], [place], or [place] counties   for at least the last three months. °  You cannot be eligible for state or federal sponsored healthcare insurance, including Veterans Administration, Medicaid, or Medicare. °  You generally cannot be eligible for healthcare insurance through your employer.  °  How to apply: °Eligibility screenings are held every Tuesday and Wednesday afternoon from 1:00 pm until 4:00 pm. You do not need an appointment for the interview!  °[facility] [address], [place], [place] 336-631-2330   °[facility] [facility]  336-342-8273   °[facility]  336-703-3100   °[facility]  336-570-6415   ° °Behavioral Health Resources in the Community: °Intensive Outpatient Programs °Organization         Address  Phone  Notes  °[facility] [address], [place], [place] 336-878-6098   °[facility] [address], [place], [place] 336-832-9800   °[facility] [address], [place], [place] ° 336-882-2125   °[facility] [address],  °[place], [place] 1-800-853-5163 or 336-641-4981   °Substance Abuse Resources °Organization         Address  Phone  Notes  °[facility]  336-882-2125   °[facility]  336-784-9470   °[facility]  336-285-9073   °[facility]  336-845-3988   °Residential & Outpatient Substance Abuse Program  1-800-659-3381   °Psychological Services °Organization          Address  Phone  Notes  °[facility]  336- 832-9600   °[facility]  336- 378-7881   °[facility] [address], [place] 1-800-853-5163 or 336-641-4981   ° °Mobile Crisis Teams °Organization         Address  Phone  Notes  °[facility], Mobile Crisis Care Unit  1-877-626-1772   °Assertive °[facility] ° [address] [place], [place] 336-834-9664   °Sharon DeEsch [address] °[place] 336-554-5454   ° °Self-Help/Support Groups °Organization         Address  Phone             Notes  °[facility] [place] - variety of support groups  336- 373-1402 Call for more information  °Narcotics Anonymous (NA), [facility] [address], °[place]  2 meetings at this location  ° °Residential Treatment Programs °Organization         Address  Phone  Notes  °[facility] [address],    °[place]  1-866-801-8205   °[facility] ° [address], [place], [place] 704-293-8524   °[facility] [address], [place] 336-845-3988 Admissions: 8am-3pm M-F  °[facility] [address]    °[place], [place] 336-841-1104   °[facility] [address], [place], [place] 336-379-7146   °[facility] [address]  °[place], [place] 336-285-9073   °[facility] - Intensive Outpatient [address], [place], [place] 336-852-3033   °[facility] [address]  °[place], [place] 1-877-615-2722 or 336-784-9470   °[facility] [address] [place], [place] 336-227-7417 Accepts Medicaid  °[facility] [address]  °[place]  1-800-659-3381 Substance Abuse/Addiction Treatment  ° °[place] Behavioral Health Resources °Organization         Address  Phone  Notes  °[facility]  (888)   581-9988   °Julie Brannon, PhD [address] [place], [place]   (336) 349-5553 or (336) 951-0000   °[facility]   [address] °[place], [place] (336) 349-4454   °[facility] [address], [place], [place] (336) 342-8316 Insurance/Medicaid/sponsorship through [facility]  °[facility] [address]                                    [place], [place] (336) 342-8316 Therapy/tele-psych/case  °[facility] [address]  ° [place], [place] (336) 349-2233    °Dr. Arfeen  (336) 349-4544   °[facility] [place]  [employer] [place] [facility] 1) [address], [place] °2) [address], [place] °3)  [address], [place] (336) 349-3220 °(336) 342-7768 ° °(336) 342-8140   °[place] [facility] (336) 342-1394 or (336) 342-3537 (After Hours)    ° ° ° °

## 2015-06-05 NOTE — ED Notes (Signed)
Pt verbalized understanding of d/c instructions and has no further questions. Pt given resources to find a dentist.

## 2015-06-06 LAB — URINE CULTURE

## 2015-06-09 LAB — CULTURE, BLOOD (ROUTINE X 2)
CULTURE: NO GROWTH
Culture: NO GROWTH

## 2015-12-23 ENCOUNTER — Encounter (HOSPITAL_COMMUNITY): Payer: Self-pay | Admitting: *Deleted

## 2015-12-23 ENCOUNTER — Emergency Department (HOSPITAL_COMMUNITY)
Admission: EM | Admit: 2015-12-23 | Discharge: 2015-12-23 | Disposition: A | Discharge status: Home / Self Care | Payer: Self-pay | Source: Home / Self Care | Attending: Family Medicine | Admitting: Family Medicine

## 2015-12-23 DIAGNOSIS — H53143 Visual discomfort, bilateral: Secondary | ICD-10-CM

## 2015-12-23 MED ORDER — TETRACAINE HCL 0.5 % OP SOLN
OPHTHALMIC | Status: AC
  Filled 2015-12-23: qty 2

## 2015-12-23 NOTE — ED Provider Notes (Signed)
CSN: [id number]     Arrival date & time 12/23/15  1757 History   First MD Initiated Contact with Patient 12/23/15 1932     Chief Complaint  Patient presents with  . Eye Problem   (Consider location/radiation/quality/duration/timing/severity/associated sxs/prior Treatment) Patient is a 35 y.o. female presenting with eye problem. The history is provided by the patient.  Eye Problem Location:  Both Quality:  Dull Severity:  Mild Onset quality:  Gradual Duration:  1 week Progression:  Waxing and waning Chronicity:  New Context comment:  Tunnel vision sens, , works on assembly and feeling eye soreness Relieved by:  None tried Worsened by:  Nothing tried Associated symptoms: no blurred vision, no decreased vision, no discharge, no double vision, no itching, no photophobia, no redness, no swelling and no tearing     Past Medical History  Diagnosis Date  . Seizures (HCC) 2007    No longer taking medications. Last instance was with pregnancy.    Past Surgical History  Procedure Laterality Date  . Ectopic pregnancy surgery     No family history on file. Social History  Substance Use Topics  . Smoking status: Current Some Day Smoker  . Smokeless tobacco: None  . Alcohol Use: Yes     Comment: ocassionally   OB History    No data available     Review of Systems  Constitutional: Negative.   Eyes: Positive for pain. Negative for blurred vision, double vision, photophobia, discharge, redness, itching and visual disturbance.  Neurological: Negative.   All other systems reviewed and are negative.   Allergies  Levetiracetam  Home Medications   Prior to Admission medications   Medication Sig Start Date End Date Taking? Authorizing Provider  ibuprofen (ADVIL,MOTRIN) 200 MG tablet Take 800 mg by mouth every 6 (six) hours as needed for moderate pain.    Historical Provider, MD  ibuprofen (ADVIL,MOTRIN) 600 MG tablet Take 1 tablet (600 mg total) by mouth every 6 (six) hours as  needed. 06/05/15   Raeford Razor, MD  naproxen sodium (ANAPROX) 220 MG tablet Take 440 mg by mouth daily as needed (dental pain).    Historical Provider, MD  oxyCODONE-acetaminophen (PERCOCET/ROXICET) 5-325 MG per tablet Take 1-2 tablets by mouth every 4 (four) hours as needed for severe pain. 06/05/15   Raeford Razor, MD   Meds Ordered and Administered this Visit  Medications - No data to display  BP 123/79 mmHg  Pulse 78  Temp(Src) 98.3 F (36.8 C) (Oral)  Resp 18  SpO2 99%  LMP 12/06/2015 No data found.   Physical Exam  Constitutional: She is oriented to person, place, and time. She appears well-developed and well-nourished. She appears distressed.  Eyes: Conjunctivae and EOM are normal. Pupils are equal, round, and reactive to light. Right eye exhibits no discharge. Left eye exhibits no discharge.  Fundoscopic exam:      The right eye shows no hemorrhage and no papilledema. The right eye shows venous pulsations.       The left eye shows no hemorrhage and no papilledema. The left eye shows venous pulsations.  tonopen iop right 20 left 19.  Neck: Normal range of motion. Neck supple.  Lymphadenopathy:    She has no cervical adenopathy.  Neurological: She is alert and oriented to person, place, and time. No cranial nerve deficit.  Skin: Skin is warm and dry.  Nursing note and vitals reviewed.   ED Course  Procedures (including critical care time)  Labs Review Labs Reviewed - No  data to display  Imaging Review No results found.   Visual Acuity Review  Right Eye Distance: 20/40 Left Eye Distance: 20/40 Bilateral Distance: 20/30  Right Eye Near:   Left Eye Near:    Bilateral Near:         MDM   1. Simple eye strain, bilateral        Linna Hoff, MD 12/23/15 2002

## 2015-12-23 NOTE — Discharge Instructions (Signed)
Tylenol and rest and see eye doctor if further problems.

## 2015-12-23 NOTE — ED Notes (Signed)
Pt  Reports    Symptoms   Of      Eye  Pressure       And  What  She  Describes   As  Tunnel  Vision          With  Blurred  Vision        X  2  Weeks     Symptoms   X  1  Week                pt  Repots  The  Symptoms  Not  releived  By  ibuprophen     Pt  Seems  Slightly  Anxious

## 2016-01-20 ENCOUNTER — Encounter (HOSPITAL_COMMUNITY): Payer: Self-pay | Admitting: *Deleted

## 2016-01-20 ENCOUNTER — Emergency Department (HOSPITAL_COMMUNITY)
Admission: EM | Admit: 2016-01-20 | Discharge: 2016-01-20 | Disposition: A | Discharge status: Home / Self Care | Payer: Medicaid Other | Attending: Emergency Medicine | Admitting: Emergency Medicine

## 2016-01-20 DIAGNOSIS — N939 Abnormal uterine and vaginal bleeding, unspecified: Secondary | ICD-10-CM | POA: Insufficient documentation

## 2016-01-20 DIAGNOSIS — Z3202 Encounter for pregnancy test, result negative: Secondary | ICD-10-CM | POA: Insufficient documentation

## 2016-01-20 DIAGNOSIS — F17200 Nicotine dependence, unspecified, uncomplicated: Secondary | ICD-10-CM | POA: Insufficient documentation

## 2016-01-20 DIAGNOSIS — N3091 Cystitis, unspecified with hematuria: Secondary | ICD-10-CM

## 2016-01-20 LAB — CBC WITH DIFFERENTIAL/PLATELET
Basophils Absolute: 0 10*3/uL (ref 0.0–0.1)
Basophils Relative: 0 %
EOS ABS: 0.3 10*3/uL (ref 0.0–0.7)
Eosinophils Relative: 2 %
HCT: 38.7 % (ref 36.0–46.0)
HEMOGLOBIN: 12.5 g/dL (ref 12.0–15.0)
Lymphocytes Relative: 23 %
Lymphs Abs: 3.7 10*3/uL (ref 0.7–4.0)
MCH: 29 pg (ref 26.0–34.0)
MCHC: 32.3 g/dL (ref 30.0–36.0)
MCV: 89.8 fL (ref 78.0–100.0)
MONOS PCT: 6 %
Monocytes Absolute: 1 10*3/uL (ref 0.1–1.0)
NEUTROS ABS: 10.9 10*3/uL — AB (ref 1.7–7.7)
NEUTROS PCT: 69 %
Platelets: 454 10*3/uL — ABNORMAL HIGH (ref 150–400)
RBC: 4.31 MIL/uL (ref 3.87–5.11)
RDW: 13.8 % (ref 11.5–15.5)
WBC: 15.8 10*3/uL — AB (ref 4.0–10.5)

## 2016-01-20 LAB — URINALYSIS, ROUTINE W REFLEX MICROSCOPIC
BILIRUBIN URINE: NEGATIVE
Glucose, UA: NEGATIVE mg/dL
KETONES UR: NEGATIVE mg/dL
Nitrite: NEGATIVE
PH: 6.5 (ref 5.0–8.0)
PROTEIN: 30 mg/dL — AB
Specific Gravity, Urine: 1.013 (ref 1.005–1.030)

## 2016-01-20 LAB — COMPREHENSIVE METABOLIC PANEL
ALK PHOS: 90 U/L (ref 38–126)
ALT: 20 U/L (ref 14–54)
AST: 27 U/L (ref 15–41)
Albumin: 4.1 g/dL (ref 3.5–5.0)
Anion gap: 9 (ref 5–15)
BUN: 7 mg/dL (ref 6–20)
CALCIUM: 9.5 mg/dL (ref 8.9–10.3)
CO2: 27 mmol/L (ref 22–32)
CREATININE: 0.81 mg/dL (ref 0.44–1.00)
Chloride: 104 mmol/L (ref 101–111)
GFR calc non Af Amer: >60 mL/min (ref >60)
Glucose, Bld: 83 mg/dL (ref 65–99)
Potassium: 3.7 mmol/L (ref 3.5–5.1)
SODIUM: 140 mmol/L (ref 135–145)
Total Bilirubin: 0.8 mg/dL (ref 0.3–1.2)
Total Protein: 7.5 g/dL (ref 6.5–8.1)

## 2016-01-20 LAB — URINE MICROSCOPIC-ADD ON

## 2016-01-20 LAB — WET PREP, GENITAL
Clue Cells Wet Prep HPF POC: NONE SEEN
Sperm: NONE SEEN
TRICH WET PREP: NONE SEEN
Yeast Wet Prep HPF POC: NONE SEEN

## 2016-01-20 LAB — PREGNANCY, URINE: PREG TEST UR: NEGATIVE

## 2016-01-20 MED ORDER — CEPHALEXIN 500 MG PO CAPS: 500.0000 mg | ORAL_CAPSULE | Freq: Two times a day (BID) | ORAL | Status: DC

## 2016-01-20 MED ORDER — ACETAMINOPHEN 325 MG PO TABS
650.0000 mg | ORAL_TABLET | Freq: Once | ORAL | Status: AC
  Administered 2016-01-20: 650 mg via ORAL
  Filled 2016-01-20: qty 2

## 2016-01-20 MED ORDER — IBUPROFEN 800 MG PO TABS: 800.0000 mg | ORAL_TABLET | Freq: Three times a day (TID) | ORAL | Status: DC

## 2016-01-20 NOTE — Discharge Instructions (Signed)
1. Continue home medications. 2. Start taking Keflex twice a day for UTI.  Take ibuprofen for pain.  Continue taking Azos for burning with urination. 3.  Follow up with [facility] for vaginal bleeding and Urology if you continue having hematuria despite taking these antibiotics.  Urinary Tract Infection Urinary tract infections (UTIs) can develop anywhere along your urinary tract. Your urinary tract is your body's drainage system for removing wastes and extra water. Your urinary tract includes two kidneys, two ureters, a bladder, and a urethra. Your kidneys are a pair of bean-shaped organs. Each kidney is about the size of your fist. They are located below your ribs, one on each side of your spine. CAUSES Infections are caused by microbes, which are microscopic organisms, including fungi, viruses, and bacteria. These organisms are so small that they can only be seen through a microscope. Bacteria are the microbes that most commonly cause UTIs. SYMPTOMS  Symptoms of UTIs may vary by age and gender of the patient and by the location of the infection. Symptoms in young women typically include a frequent and intense urge to urinate and a painful, burning feeling in the bladder or urethra during urination. Older women and men are more likely to be tired, shaky, and weak and have muscle aches and abdominal pain. A fever may mean the infection is in your kidneys. Other symptoms of a kidney infection include pain in your back or sides below the ribs, nausea, and vomiting. DIAGNOSIS To diagnose a UTI, your caregiver will ask you about your symptoms. Your caregiver will also ask you to provide a urine sample. The urine sample will be tested for bacteria and white blood cells. White blood cells are made by your body to help fight infection. TREATMENT  Typically, UTIs can be treated with medication. Because most UTIs are caused by a bacterial infection, they usually can be treated with the use of antibiotics.  The choice of antibiotic and length of treatment depend on your symptoms and the type of bacteria causing your infection. HOME CARE INSTRUCTIONS  If you were prescribed antibiotics, take them exactly as your caregiver instructs you. Finish the medication even if you feel better after you have only taken some of the medication.  Drink enough water and fluids to keep your urine clear or pale yellow.  Avoid caffeine, tea, and carbonated beverages. They tend to irritate your bladder.  Empty your bladder often. Avoid holding urine for long periods of time.  Empty your bladder before and after sexual intercourse.  After a bowel movement, women should cleanse from front to back. Use each tissue only once. SEEK MEDICAL CARE IF:   You have back pain.  You develop a fever.  Your symptoms do not begin to resolve within 3 days. SEEK IMMEDIATE MEDICAL CARE IF:   You have severe back pain or lower abdominal pain.  You develop chills.  You have nausea or vomiting.  You have continued burning or discomfort with urination. MAKE SURE YOU:   Understand these instructions.  Will watch your condition.  Will get help right away if you are not doing well or get worse.   This information is not intended to replace advice given to you by your health care provider. Make sure you discuss any questions you have with your health care provider.   Document Released: 07/27/2005 Document Revised: 07/08/2015 Document Reviewed: 11/25/2011 Elsevier Interactive Patient Education [employer]2016 Elsevier Inc.

## 2016-01-20 NOTE — ED Notes (Signed)
Pt reports onset yesterday of n/v and blood in urine, painful urination, and chills. No acute distress noted at triage.

## 2016-01-20 NOTE — ED Provider Notes (Signed)
CSN: [id number]     Arrival date & time 01/20/16  1424 History  By signing my name below, I, Freida Busman, attest that this documentation has been prepared under the direction and in the presence of non-physician practitioner, Cheri Fowler, PA-C. Electronically Signed: Freida Busman, Scribe. 01/20/2016. 6:10 PM.   Chief Complaint  Patient presents with  . Hematuria   The history is provided by the patient. No language interpreter was used.    HPI Comments:  Kristen Roberts is a 35 y.o. female who presents to the Emergency Department complaining of hematuria and dysuria since last night. She reports associated nausea, vomiting, chills,  suprapubic abdominal pain and urinary frequency. She has been taking Azo. She denies vaginal bleeding, vaginal discharge, CP and SOB. She also denies h/o kidney stones. No alleviating factors noted. Pt is sexually active with one partner and they use condoms. Pt states her LNMP was March 3rd, 2017 and reports h/o tubal ligation; does not believe she is pregnant. Pt smokes ~ 0.5 packs a day.    Past Medical History  Diagnosis Date  . Seizures (HCC) 2007    No longer taking medications. Last instance was with pregnancy.    Past Surgical History  Procedure Laterality Date  . Ectopic pregnancy surgery     History reviewed. No pertinent family history. Social History  Substance Use Topics  . Smoking status: Current Some Day Smoker  . Smokeless tobacco: None  . Alcohol Use: Yes     Comment: ocassionally   OB History    No data available     Review of Systems  Constitutional: Positive for chills.  Gastrointestinal: Positive for nausea, vomiting and abdominal pain.  Genitourinary: Positive for dysuria, frequency and hematuria. Negative for vaginal bleeding and vaginal discharge.  All other systems reviewed and are negative.  Allergies  Levetiracetam  Home Medications   Prior to Admission medications   Medication Sig Start Date End Date Taking?  Authorizing Provider  ibuprofen (ADVIL,MOTRIN) 200 MG tablet Take 800 mg by mouth every 6 (six) hours as needed for moderate pain.    Historical Provider, MD  ibuprofen (ADVIL,MOTRIN) 600 MG tablet Take 1 tablet (600 mg total) by mouth every 6 (six) hours as needed. 06/05/15   Raeford Razor, MD  naproxen sodium (ANAPROX) 220 MG tablet Take 440 mg by mouth daily as needed (dental pain).    Historical Provider, MD  oxyCODONE-acetaminophen (PERCOCET/ROXICET) 5-325 MG per tablet Take 1-2 tablets by mouth every 4 (four) hours as needed for severe pain. 06/05/15   Raeford Razor, MD   BP 130/81 mmHg  Pulse 88  Temp(Src) 99 F (37.2 C) (Oral)  Resp 18  SpO2 100%  LMP 01/01/2016 Physical Exam  Constitutional: She is oriented to person, place, and time. She appears well-developed and well-nourished.  Non-toxic appearance. She does not have a sickly appearance. She does not appear ill.  HENT:  Head: Normocephalic and atraumatic.  Mouth/Throat: Oropharynx is clear and moist.  Eyes: Conjunctivae are normal. Pupils are equal, round, and reactive to light.  Neck: Normal range of motion. Neck supple.  Cardiovascular: Normal rate, regular rhythm and normal heart sounds.   No murmur heard. Pulmonary/Chest: Effort normal and breath sounds normal. No accessory muscle usage or stridor. No respiratory distress. She has no wheezes. She has no rhonchi. She has no rales.  Abdominal: Soft. Bowel sounds are normal. She exhibits no distension. There is tenderness in the suprapubic area. There is no rigidity, no rebound, no guarding  and no CVA tenderness.  Genitourinary: There is no rash, tenderness or lesion on the right labia. There is no rash, tenderness or lesion on the left labia. Cervix exhibits no motion tenderness and no discharge. Right adnexum displays no tenderness. Left adnexum displays no tenderness. There is bleeding in the vagina. No erythema or tenderness in the vagina. No signs of injury around the vagina.  No vaginal discharge found.  Musculoskeletal: Normal range of motion.  Lymphadenopathy:    She has no cervical adenopathy.  Neurological: She is alert and oriented to person, place, and time.  Speech clear without dysarthria.  Skin: Skin is warm and dry.  Psychiatric: She has a normal mood and affect. Her behavior is normal.    ED Course  Procedures  DIAGNOSTIC STUDIES:  Oxygen Saturation is 100% on RA, normal by my interpretation.    COORDINATION OF CARE:  6:09 PM Discussed treatment plan with pt at bedside and pt agreed to plan.  Labs Review Labs Reviewed  WET PREP, GENITAL - Abnormal; Notable for the following:    WBC, Wet Prep HPF POC MANY (*)    All other components within normal limits  URINALYSIS, ROUTINE W REFLEX MICROSCOPIC (NOT AT [facility]) - Abnormal; Notable for the following:    APPearance CLOUDY (*)    Hgb urine dipstick LARGE (*)    Protein, ur 30 (*)    Leukocytes, UA MODERATE (*)    All other components within normal limits  URINE MICROSCOPIC-ADD ON - Abnormal; Notable for the following:    Squamous Epithelial / LPF 0-5 (*)    Bacteria, UA RARE (*)    All other components within normal limits  CBC WITH DIFFERENTIAL/PLATELET - Abnormal; Notable for the following:    WBC 15.8 (*)    Platelets 454 (*)    Neutro Abs 10.9 (*)    All other components within normal limits  URINE CULTURE  PREGNANCY, URINE  COMPREHENSIVE METABOLIC PANEL  RPR  HIV ANTIBODY (ROUTINE TESTING)  POC URINE PREG, ED  GC/CHLAMYDIA PROBE AMP ([place]) NOT AT [facility]    Imaging Review No results found. I have personally reviewed and evaluated these images and lab results as part of my medical decision-making.    MDM   Final diagnoses:  Hemorrhagic cystitis   Patient presents with hematuria, dysuria, and increased urinary frequency.  Suprapubic abdominal pain, subjective fevers, N/V.  VSS, NAD.  On exam, heart RRR, lungs CTAB, abdomen soft with suprapubic tenderness.  No  rebound, guarding, or rigidity.  No CVA tenderness.  GU exam, shows mild blood in vaginal vault, no CMT, or adnexal tenderness. Low suspicion for TOA or PID. UA shows large hematuria with TNTC RBCs and moderate leukocytes, 6-30 WBCs, nitrite negative and rare bacteria. Urine pregnancy negative.  Concern for UTI/cystitis.  Doubt pyelonephritis.  Doubt nephrolithiasis. Urine culture sent.  CBC remarkable for WBC 14.8, appears chronic, HGB 12.5.  CMP unremarkable, Cr 0.81.  Wet prep shows many WBCs.  GC/chlamydia, HIV, RPR pending.  Suspect hemorrhagic cystitis.  Plan to discharge home with keflex and pyridium.  Case has been discussed with Dr. Juleen ChinaKohut who agrees with the above plan for discharge.   I personally performed the services described in this documentation, which was scribed in my presence. The recorded information has been reviewed and is accurate.    Cheri FowlerKayla Kaniah Rizzolo, PA-C 01/20/16 Ninfa Linden1938  Raeford RazorStephen Kohut, MD 01/21/16 2220

## 2016-01-21 LAB — GC/CHLAMYDIA PROBE AMP (~~LOC~~) NOT AT ARMC
CHLAMYDIA, DNA PROBE: NEGATIVE
Neisseria Gonorrhea: NEGATIVE

## 2016-01-21 LAB — RPR: RPR: NONREACTIVE

## 2016-01-21 LAB — HIV ANTIBODY (ROUTINE TESTING W REFLEX): HIV SCREEN 4TH GENERATION: NONREACTIVE

## 2016-01-22 LAB — URINE CULTURE

## 2016-01-23 ENCOUNTER — Telehealth: Payer: Self-pay | Admitting: *Deleted

## 2016-01-23 NOTE — ED Notes (Signed)
(+)  urine culture reviewed by Mayme GentaBen Cartner, PA.  Treated with Cephalexin, no changes needed.

## 2016-07-15 ENCOUNTER — Emergency Department (HOSPITAL_COMMUNITY): Payer: Self-pay

## 2016-07-15 ENCOUNTER — Encounter (HOSPITAL_COMMUNITY): Payer: Self-pay

## 2016-07-15 ENCOUNTER — Emergency Department (HOSPITAL_COMMUNITY)
Admission: EM | Admit: 2016-07-15 | Discharge: 2016-07-15 | Disposition: A | Discharge status: Home / Self Care | Payer: Self-pay | Attending: Emergency Medicine | Admitting: Emergency Medicine

## 2016-07-15 DIAGNOSIS — F17210 Nicotine dependence, cigarettes, uncomplicated: Secondary | ICD-10-CM | POA: Insufficient documentation

## 2016-07-15 DIAGNOSIS — J40 Bronchitis, not specified as acute or chronic: Secondary | ICD-10-CM | POA: Insufficient documentation

## 2016-07-15 LAB — CBC
HEMATOCRIT: 35.9 % — AB (ref 36.0–46.0)
Hemoglobin: 11.6 g/dL — ABNORMAL LOW (ref 12.0–15.0)
MCH: 29.1 pg (ref 26.0–34.0)
MCHC: 32.3 g/dL (ref 30.0–36.0)
MCV: 90.2 fL (ref 78.0–100.0)
Platelets: 456 10*3/uL — ABNORMAL HIGH (ref 150–400)
RBC: 3.98 MIL/uL (ref 3.87–5.11)
RDW: 14.1 % (ref 11.5–15.5)
WBC: 11.9 10*3/uL — AB (ref 4.0–10.5)

## 2016-07-15 LAB — BASIC METABOLIC PANEL
Anion gap: 6 (ref 5–15)
BUN: 7 mg/dL (ref 6–20)
CO2: 26 mmol/L (ref 22–32)
CREATININE: 0.84 mg/dL (ref 0.44–1.00)
Calcium: 9.1 mg/dL (ref 8.9–10.3)
Chloride: 105 mmol/L (ref 101–111)
GFR calc Af Amer: >60 mL/min (ref >60)
GLUCOSE: 93 mg/dL (ref 65–99)
POTASSIUM: 3.9 mmol/L (ref 3.5–5.1)
SODIUM: 137 mmol/L (ref 135–145)

## 2016-07-15 LAB — WET PREP, GENITAL
CLUE CELLS WET PREP: NONE SEEN
Sperm: NONE SEEN
YEAST WET PREP: NONE SEEN

## 2016-07-15 LAB — I-STAT TROPONIN, ED: Troponin i, poc: 0 ng/mL (ref 0.00–0.08)

## 2016-07-15 MED ORDER — AZITHROMYCIN 250 MG PO TABS: ORAL_TABLET | ORAL | 0 refills | Status: DC

## 2016-07-15 MED ORDER — METRONIDAZOLE 500 MG PO TABS
2000.0000 mg | ORAL_TABLET | Freq: Once | ORAL | Status: AC
  Administered 2016-07-15: 2000 mg via ORAL
  Filled 2016-07-15: qty 4

## 2016-07-15 NOTE — Discharge Instructions (Signed)
Follow up with a family md if not improving

## 2016-07-15 NOTE — ED Provider Notes (Signed)
[facility]-EMERGENCY DEPT Provider Note   CSN: [id number] Arrival date & time: 07/15/16  [id number]     History   Chief Complaint Chief Complaint  Patient presents with  . Fever  . Chest Pain    HPI Kristen Roberts is a 35 y.o. female.  Patient complains of cough with blood and sputum for  a week.   Patient also complains of mild discharge from vagina   The history is provided by the patient.  Fever   This is a new problem. The current episode started more than 2 days ago. The problem occurs constantly. The problem has not changed since onset.Her temperature was unmeasured prior to arrival. Associated symptoms include cough. Pertinent negatives include no chest pain, no diarrhea, no congestion and no headaches. She has tried nothing for the symptoms. The treatment provided no relief.    Past Medical History:  Diagnosis Date  . Seizures (HCC) 2007   No longer taking medications. Last instance was with pregnancy.     Patient Active Problem List   Diagnosis Date Noted  . LEUKOCYTOSIS 10/02/2008  . ALLERGIC RHINITIS 10/02/2008  . SEIZURE DISORDER 07/30/2008  . TOBACCO ABUSE 07/29/2008  . ANXIETY DISORDER 07/28/2008  . DEPRESSION 07/28/2008  . SLEEP DISORDER 07/28/2008  . OTHER AND UNSPECIFIED OVARIAN CYST 06/26/2008    Past Surgical History:  Procedure Laterality Date  . ECTOPIC PREGNANCY SURGERY      OB History    No data available       Home Medications    Prior to Admission medications   Medication Sig Start Date End Date Taking? Authorizing Provider  azithromycin (ZITHROMAX Z-PAK) 250 MG tablet 2 po day one, then 1 daily x 4 days 07/15/16   Bethann BerkshireJoseph Ashdon Gillson, MD  cephALEXin (KEFLEX) 500 MG capsule Take 1 capsule (500 mg total) by mouth 2 (two) times daily. 01/20/16   Cheri FowlerKayla Rose, PA-C  ibuprofen (ADVIL,MOTRIN) 800 MG tablet Take 1 tablet (800 mg total) by mouth 3 (three) times daily. 01/20/16   Cheri FowlerKayla Rose, PA-C  naproxen sodium (ANAPROX) 220 MG tablet Take 440 mg by mouth  daily as needed (dental pain).    Historical Provider, MD  oxyCODONE-acetaminophen (PERCOCET/ROXICET) 5-325 MG per tablet Take 1-2 tablets by mouth every 4 (four) hours as needed for severe pain. 06/05/15   Raeford RazorStephen Kohut, MD    Family History No family history on file.  Social History Social History  Substance Use Topics  . Smoking status: Current Some Day Smoker    Packs/day: 0.50    Types: Cigarettes  . Smokeless tobacco: Never Used  . Alcohol use Yes     Comment: ocassionally     Allergies   Levetiracetam   Review of Systems Review of Systems  Constitutional: Negative for appetite change and fatigue.  HENT: Negative for congestion, ear discharge and sinus pressure.   Eyes: Negative for discharge.  Respiratory: Positive for cough.   Cardiovascular: Negative for chest pain.  Gastrointestinal: Negative for abdominal pain and diarrhea.  Genitourinary: Positive for vaginal discharge. Negative for frequency and hematuria.  Musculoskeletal: Negative for back pain.  Skin: Negative for rash.  Neurological: Negative for seizures and headaches.  Psychiatric/Behavioral: Negative for hallucinations.     Physical Exam Updated Vital Signs BP 128/77 (BP Location: Right Arm)   Pulse 89   Temp 98.2 F (36.8 C) (Oral)   Resp 16   Ht 5\' 6"  (1.676 m)   Wt 163 lb (73.9 kg)   LMP 07/03/2016   SpO2  100%   BMI 26.31 kg/m   Physical Exam  Constitutional: She is oriented to person, place, and time. She appears well-developed.  HENT:  Head: Normocephalic.  Eyes: Conjunctivae and EOM are normal. No scleral icterus.  Neck: Neck supple. No thyromegaly present.  Cardiovascular: Normal rate and regular rhythm.  Exam reveals no gallop and no friction rub.   No murmur heard. Pulmonary/Chest: No stridor. She has no wheezes. She has no rales. She exhibits no tenderness.  Abdominal: She exhibits no distension. There is no tenderness. There is no rebound.  Genitourinary: Vaginal discharge  found.  Genitourinary Comments: Normal vaginal exam Are mild discharge  Musculoskeletal: Normal range of motion. She exhibits no edema.  Lymphadenopathy:    She has no cervical adenopathy.  Neurological: She is oriented to person, place, and time. She exhibits normal muscle tone. Coordination normal.  Skin: No rash noted. No erythema.  Psychiatric: She has a normal mood and affect. Her behavior is normal.     ED Treatments / Results  Labs (all labs ordered are listed, but only abnormal results are displayed) Labs Reviewed  WET PREP, GENITAL - Abnormal; Notable for the following:       Result Value   Trich, Wet Prep PRESENT (*)    WBC, Wet Prep HPF POC MODERATE (*)    All other components within normal limits  CBC - Abnormal; Notable for the following:    WBC 11.9 (*)    Hemoglobin 11.6 (*)    HCT 35.9 (*)    Platelets 456 (*)    All other components within normal limits  BASIC METABOLIC PANEL  I-STAT TROPOININ, ED  GC/CHLAMYDIA PROBE AMP ([place]) NOT AT [facility]    EKG  EKG Interpretation  Date/Time:  Friday July 15 2016 09:03:22 EDT Ventricular Rate:  90 PR Interval:  152 QRS Duration: 82 QT Interval:  338 QTC Calculation: 413 R Axis:   83 Text Interpretation:  Normal sinus rhythm Normal ECG Confirmed by MESNER MD, Barbara Cower [phone]) on 07/15/2016 9:05:38 AM       Radiology Dg Chest 2 View  Result Date: 07/15/2016 CLINICAL DATA:  35 year old presenting with 1 week history of cough and 1 day history of fever and mid chest pain. EXAM: CHEST  2 VIEW COMPARISON:  CT chest 08/09/2014.  Portable chest x-ray 06/26/2008. FINDINGS: Cardiomediastinal silhouette unremarkable, unchanged. Mild central peribronchial thickening, more so than on the prior examinations. Lungs otherwise clear. No localized airspace consolidation. No pleural effusions. No pneumothorax. Normal pulmonary vascularity. Thoracic levoscoliosis. IMPRESSION: Mild changes of acute bronchitis and/or asthma  without focal airspace pneumonia. Electronically Signed   By: Hulan Saas M.D.   On: 07/15/2016 10:10    Procedures Procedures (including critical care time)  Medications Ordered in ED Medications  metroNIDAZOLE (FLAGYL) tablet 2,000 mg (not administered)     Initial Impression / Assessment and Plan / ED Course  I have reviewed the triage vital signs and the nursing notes.  Pertinent labs & imaging results that were available during my care of the patient were reviewed by me and considered in my medical decision making (see chart for details).  Clinical Course    Patient with bronchitis for over a week with hemoptysis. She will be treated with Z-Pak #2 patient has trichomoniasis in her vaginal discharge. She will be treated with Flagyl  Final Clinical Impressions(s) / ED Diagnoses   Final diagnoses:  None    New Prescriptions New Prescriptions   AZITHROMYCIN (ZITHROMAX Z-PAK) 250 MG  TABLET    2 po day one, then 1 daily x 4 days     Bethann Berkshire, MD 07/15/16 1129

## 2016-07-15 NOTE — ED Triage Notes (Signed)
Per Pt, Pt is coming from home with complaints of a fever that started a few days ago with an episode of nausea and vomiting. Reports productive cough with dark green/yellow color. Reports last night starting to have a chest tightness that was uncomfortable at work. Pt reports the chest pain persisting.

## 2016-07-18 LAB — GC/CHLAMYDIA PROBE AMP (~~LOC~~) NOT AT ARMC
CHLAMYDIA, DNA PROBE: NEGATIVE
Neisseria Gonorrhea: NEGATIVE

## 2018-01-23 ENCOUNTER — Ambulatory Visit (HOSPITAL_COMMUNITY)
Admission: AD | Admit: 2018-01-23 | Discharge: 2018-01-24 | Disposition: A | Discharge status: Home / Self Care | Payer: Self-pay | Source: Ambulatory Visit | Attending: Obstetrics and Gynecology | Admitting: Obstetrics and Gynecology

## 2018-01-23 ENCOUNTER — Inpatient Hospital Stay (HOSPITAL_COMMUNITY): Payer: Self-pay

## 2018-01-23 ENCOUNTER — Encounter (HOSPITAL_COMMUNITY): Payer: Self-pay

## 2018-01-23 ENCOUNTER — Encounter (HOSPITAL_COMMUNITY)
Admission: AD | Disposition: A | Discharge status: Home / Self Care | Payer: Self-pay | Source: Ambulatory Visit | Attending: Obstetrics and Gynecology

## 2018-01-23 ENCOUNTER — Inpatient Hospital Stay (HOSPITAL_COMMUNITY): Payer: Self-pay | Admitting: Anesthesiology

## 2018-01-23 DIAGNOSIS — K661 Hemoperitoneum: Secondary | ICD-10-CM

## 2018-01-23 DIAGNOSIS — O00101 Right tubal pregnancy without intrauterine pregnancy: Secondary | ICD-10-CM | POA: Insufficient documentation

## 2018-01-23 DIAGNOSIS — F17210 Nicotine dependence, cigarettes, uncomplicated: Secondary | ICD-10-CM | POA: Insufficient documentation

## 2018-01-23 DIAGNOSIS — R109 Unspecified abdominal pain: Secondary | ICD-10-CM

## 2018-01-23 DIAGNOSIS — O009 Unspecified ectopic pregnancy without intrauterine pregnancy: Secondary | ICD-10-CM

## 2018-01-23 DIAGNOSIS — O26899 Other specified pregnancy related conditions, unspecified trimester: Secondary | ICD-10-CM

## 2018-01-23 HISTORY — PX: DIAGNOSTIC LAPAROSCOPY WITH REMOVAL OF ECTOPIC PREGNANCY: SHX6449

## 2018-01-23 HISTORY — DX: Major depressive disorder, single episode, unspecified: F32.9

## 2018-01-23 HISTORY — DX: Depression, unspecified: F32.A

## 2018-01-23 LAB — TYPE AND SCREEN
ABO/RH(D): B POS
Antibody Screen: NEGATIVE

## 2018-01-23 LAB — HCG, QUANTITATIVE, PREGNANCY: hCG, Beta Chain, Quant, S: 2893 m[IU]/mL — ABNORMAL HIGH (ref <5)

## 2018-01-23 LAB — CBC
HCT: 34.7 % — ABNORMAL LOW (ref 36.0–46.0)
Hemoglobin: 11.5 g/dL — ABNORMAL LOW (ref 12.0–15.0)
MCH: 29.3 pg (ref 26.0–34.0)
MCHC: 33.1 g/dL (ref 30.0–36.0)
MCV: 88.5 fL (ref 78.0–100.0)
PLATELETS: 460 10*3/uL — AB (ref 150–400)
RBC: 3.92 MIL/uL (ref 3.87–5.11)
RDW: 15.3 % (ref 11.5–15.5)
WBC: 18.9 10*3/uL — ABNORMAL HIGH (ref 4.0–10.5)

## 2018-01-23 LAB — URINALYSIS, ROUTINE W REFLEX MICROSCOPIC
Bilirubin Urine: NEGATIVE
Glucose, UA: NEGATIVE mg/dL
Hgb urine dipstick: NEGATIVE
KETONES UR: 5 mg/dL — AB
Leukocytes, UA: NEGATIVE
NITRITE: NEGATIVE
PH: 5 (ref 5.0–8.0)
Protein, ur: NEGATIVE mg/dL
Specific Gravity, Urine: 1.024 (ref 1.005–1.030)

## 2018-01-23 LAB — POCT PREGNANCY, URINE: Preg Test, Ur: POSITIVE — AB

## 2018-01-23 LAB — ABO/RH: ABO/RH(D): B POS

## 2018-01-23 SURGERY — LAPAROSCOPY, WITH ECTOPIC PREGNANCY SURGICAL TREATMENT
Anesthesia: General | Site: Abdomen

## 2018-01-23 MED ORDER — SODIUM CHLORIDE 0.9 % IR SOLN
Status: DC | PRN
  Administered 2018-01-23: 3000 mL

## 2018-01-23 MED ORDER — LACTATED RINGERS IV SOLN
INTRAVENOUS | Status: DC
  Administered 2018-01-23 (×2): via INTRAVENOUS

## 2018-01-23 MED ORDER — ROCURONIUM BROMIDE 100 MG/10ML IV SOLN
INTRAVENOUS | Status: AC
  Filled 2018-01-23: qty 1

## 2018-01-23 MED ORDER — MIDAZOLAM HCL 2 MG/2ML IJ SOLN
INTRAMUSCULAR | Status: AC
  Filled 2018-01-23: qty 2

## 2018-01-23 MED ORDER — KETOROLAC TROMETHAMINE 30 MG/ML IJ SOLN
INTRAMUSCULAR | Status: AC
  Filled 2018-01-23: qty 1

## 2018-01-23 MED ORDER — ROCURONIUM BROMIDE 100 MG/10ML IV SOLN
INTRAVENOUS | Status: DC | PRN
  Administered 2018-01-23: 30 mg via INTRAVENOUS
  Administered 2018-01-23: 5 mg via INTRAVENOUS

## 2018-01-23 MED ORDER — SCOPOLAMINE 1 MG/3DAYS TD PT72
MEDICATED_PATCH | TRANSDERMAL | Status: AC
  Filled 2018-01-23: qty 1

## 2018-01-23 MED ORDER — FENTANYL CITRATE (PF) 250 MCG/5ML IJ SOLN
INTRAMUSCULAR | Status: AC
  Filled 2018-01-23: qty 5

## 2018-01-23 MED ORDER — DEXAMETHASONE SODIUM PHOSPHATE 4 MG/ML IJ SOLN
INTRAMUSCULAR | Status: AC
  Filled 2018-01-23: qty 1

## 2018-01-23 MED ORDER — KETOROLAC TROMETHAMINE 30 MG/ML IJ SOLN
INTRAMUSCULAR | Status: DC | PRN
  Administered 2018-01-23: 30 mg via INTRAVENOUS

## 2018-01-23 MED ORDER — MEPERIDINE HCL 25 MG/ML IJ SOLN
6.2500 mg | INTRAMUSCULAR | Status: DC | PRN
  Administered 2018-01-23: 6.25 mg via INTRAVENOUS

## 2018-01-23 MED ORDER — PROPOFOL 10 MG/ML IV BOLUS
INTRAVENOUS | Status: AC
  Filled 2018-01-23: qty 20

## 2018-01-23 MED ORDER — SUGAMMADEX SODIUM 200 MG/2ML IV SOLN
INTRAVENOUS | Status: AC
  Filled 2018-01-23: qty 2

## 2018-01-23 MED ORDER — ONDANSETRON HCL 4 MG/2ML IJ SOLN
INTRAMUSCULAR | Status: AC
  Filled 2018-01-23: qty 2

## 2018-01-23 MED ORDER — HYDROMORPHONE HCL 1 MG/ML IJ SOLN
0.2500 mg | INTRAMUSCULAR | Status: DC | PRN
  Administered 2018-01-24: 0.5 mg via INTRAVENOUS

## 2018-01-23 MED ORDER — KETOROLAC TROMETHAMINE 30 MG/ML IJ SOLN: 30.0000 mg | Freq: Once | INTRAMUSCULAR | Status: DC

## 2018-01-23 MED ORDER — MEPERIDINE HCL 25 MG/ML IJ SOLN
INTRAMUSCULAR | Status: AC
  Filled 2018-01-23: qty 1

## 2018-01-23 MED ORDER — ONDANSETRON HCL 4 MG/2ML IJ SOLN
INTRAMUSCULAR | Status: DC | PRN
  Administered 2018-01-23: 4 mg via INTRAVENOUS

## 2018-01-23 MED ORDER — MIDAZOLAM HCL 5 MG/5ML IJ SOLN
INTRAMUSCULAR | Status: DC | PRN
  Administered 2018-01-23: 2 mg via INTRAVENOUS

## 2018-01-23 MED ORDER — LIDOCAINE HCL (CARDIAC) 20 MG/ML IV SOLN
INTRAVENOUS | Status: DC | PRN
  Administered 2018-01-23: 80 mg via INTRAVENOUS

## 2018-01-23 MED ORDER — SCOPOLAMINE 1 MG/3DAYS TD PT72
MEDICATED_PATCH | TRANSDERMAL | Status: DC | PRN
  Administered 2018-01-23: 1 via TRANSDERMAL

## 2018-01-23 MED ORDER — DEXAMETHASONE SODIUM PHOSPHATE 4 MG/ML IJ SOLN
INTRAMUSCULAR | Status: DC | PRN
  Administered 2018-01-23: 4 mg via INTRAVENOUS

## 2018-01-23 MED ORDER — SUCCINYLCHOLINE CHLORIDE 200 MG/10ML IV SOSY
PREFILLED_SYRINGE | INTRAVENOUS | Status: AC
  Filled 2018-01-23: qty 10

## 2018-01-23 MED ORDER — SUGAMMADEX SODIUM 200 MG/2ML IV SOLN
INTRAVENOUS | Status: DC | PRN
  Administered 2018-01-23: 200 mg via INTRAVENOUS

## 2018-01-23 MED ORDER — FENTANYL CITRATE (PF) 100 MCG/2ML IJ SOLN
INTRAMUSCULAR | Status: DC | PRN
  Administered 2018-01-23 (×3): 50 ug via INTRAVENOUS
  Administered 2018-01-23: 100 ug via INTRAVENOUS

## 2018-01-23 MED ORDER — SUCCINYLCHOLINE CHLORIDE 20 MG/ML IJ SOLN
INTRAMUSCULAR | Status: DC | PRN
  Administered 2018-01-23: 100 mg via INTRAVENOUS

## 2018-01-23 MED ORDER — PROPOFOL 10 MG/ML IV BOLUS
INTRAVENOUS | Status: DC | PRN
  Administered 2018-01-23: 200 mg via INTRAVENOUS

## 2018-01-23 MED ORDER — PROMETHAZINE HCL 25 MG/ML IJ SOLN: 6.2500 mg | INTRAMUSCULAR | Status: DC | PRN

## 2018-01-23 MED ORDER — LIDOCAINE HCL (CARDIAC) 20 MG/ML IV SOLN
INTRAVENOUS | Status: AC
  Filled 2018-01-23: qty 5

## 2018-01-23 SURGICAL SUPPLY — 32 items
APPLICATOR ARISTA FLEXITIP XL (MISCELLANEOUS) IMPLANT
CLOSURE WOUND 1/2 X4 (GAUZE/BANDAGES/DRESSINGS) ×1
DRSG COVADERM PLUS 2X2 (GAUZE/BANDAGES/DRESSINGS) ×3 IMPLANT
DRSG OPSITE POSTOP 3X4 (GAUZE/BANDAGES/DRESSINGS) ×3 IMPLANT
DURAPREP 26ML APPLICATOR (WOUND CARE) ×3 IMPLANT
GLOVE BIOGEL PI IND STRL 6.5 (GLOVE) ×2 IMPLANT
GLOVE BIOGEL PI IND STRL 7.0 (GLOVE) ×2 IMPLANT
GLOVE BIOGEL PI INDICATOR 6.5 (GLOVE) ×4
GLOVE BIOGEL PI INDICATOR 7.0 (GLOVE) ×4
GLOVE ORTHOPEDIC STR SZ6.5 (GLOVE) ×3 IMPLANT
GOWN STRL REUS W/TWL LRG LVL3 (GOWN DISPOSABLE) ×9 IMPLANT
HEMOSTAT ARISTA ABSORB 3G PWDR (MISCELLANEOUS) IMPLANT
LIGASURE VESSEL 5MM BLUNT TIP (ELECTROSURGICAL) ×3 IMPLANT
NDL INSUFF ACCESS 14 VERSASTEP (NEEDLE) ×3 IMPLANT
NS IRRIG 1000ML POUR BTL (IV SOLUTION) ×3 IMPLANT
PACK LAPAROSCOPY BASIN (CUSTOM PROCEDURE TRAY) ×3 IMPLANT
PACK TRENDGUARD 450 HYBRID PRO (MISCELLANEOUS) ×1 IMPLANT
PACK TRENDGUARD 600 HYBRD PROC (MISCELLANEOUS) IMPLANT
POUCH SPECIMEN RETRIEVAL 10MM (ENDOMECHANICALS) ×3 IMPLANT
PROTECTOR NERVE ULNAR (MISCELLANEOUS) ×6 IMPLANT
SET IRRIG TUBING LAPAROSCOPIC (IRRIGATION / IRRIGATOR) ×3 IMPLANT
STRIP CLOSURE SKIN 1/2X4 (GAUZE/BANDAGES/DRESSINGS) ×2 IMPLANT
SUT MNCRL AB 4-0 PS2 18 (SUTURE) ×3 IMPLANT
SUT VICRYL 0 UR6 27IN ABS (SUTURE) ×3 IMPLANT
TOWEL OR 17X24 6PK STRL BLUE (TOWEL DISPOSABLE) ×6 IMPLANT
TRAY FOLEY CATH SILVER 14FR (SET/KITS/TRAYS/PACK) ×3 IMPLANT
TRENDGUARD 450 HYBRID PRO PACK (MISCELLANEOUS) ×3
TRENDGUARD 600 HYBRID PROC PK (MISCELLANEOUS)
TROCAR VERSASTEP PLUS 12MM (TROCAR) ×3 IMPLANT
TROCAR VERSASTEP PLUS 5MM (TROCAR) ×6 IMPLANT
TROCAR XCEL NON-BLD 5MMX100MML (ENDOMECHANICALS) ×3 IMPLANT
WARMER LAPAROSCOPE (MISCELLANEOUS) ×3 IMPLANT

## 2018-01-23 NOTE — Anesthesia Preprocedure Evaluation (Signed)
Anesthesia Evaluation  Patient identified by MRN, date of birth, ID band Patient awake    Reviewed: Allergy & Precautions, NPO status , Patient's Chart, lab work & pertinent test results  Airway Mallampati: II  TM Distance: >3 FB Neck ROM: Full    Dental  (+) Dental Advisory Given   Pulmonary Current Smoker,    breath sounds clear to auscultation       Cardiovascular negative cardio ROS   Rhythm:Regular Rate:Normal     Neuro/Psych negative neurological ROS     GI/Hepatic negative GI ROS, Neg liver ROS,   Endo/Other  negative endocrine ROS  Renal/GU negative Renal ROS     Musculoskeletal   Abdominal   Peds  Hematology  (+) anemia ,   Anesthesia Other Findings   Reproductive/Obstetrics Ruptured ectopic pregnancy                             Lab Results  Component Value Date   WBC 18.9 (H) 01/23/2018   HGB 11.5 (L) 01/23/2018   HCT 34.7 (L) 01/23/2018   MCV 88.5 01/23/2018   PLT 460 (H) 01/23/2018   Anesthesia Physical Anesthesia Plan  ASA: II and emergent  Anesthesia Plan: General   Post-op Pain Management:    Induction: Intravenous  PONV Risk Score and Plan: 3 and Midazolam, Dexamethasone, Ondansetron and Treatment may vary due to age or medical condition  Airway Management Planned: Oral ETT  Additional Equipment:   Intra-op Plan:   Post-operative Plan: Extubation in OR  Informed Consent: I have reviewed the patients History and Physical, chart, labs and discussed the procedure including the risks, benefits and alternatives for the proposed anesthesia with the patient or authorized representative who has indicated his/her understanding and acceptance.   Dental advisory given  Plan Discussed with: CRNA  Anesthesia Plan Comments:         Anesthesia Quick Evaluation

## 2018-01-23 NOTE — Anesthesia Procedure Notes (Signed)
Procedure Name: Intubation Date/Time: 01/23/2018 10:23 PM Performed by: Elenore Paddy, CRNA Pre-anesthesia Checklist: Emergency Drugs available, Suction available, Patient being monitored, Patient identified and Timeout performed Patient Re-evaluated:Patient Re-evaluated prior to induction Oxygen Delivery Method: Circle system utilized Preoxygenation: Pre-oxygenation with 100% oxygen Induction Type: IV induction, Cricoid Pressure applied and Rapid sequence Laryngoscope Size: Mac and 3 Grade View: Grade I Tube type: Oral Tube size: 7.0 mm Number of attempts: 1 Airway Equipment and Method: Stylet Placement Confirmation: ETT inserted through vocal cords under direct vision,  positive ETCO2 and CO2 detector Secured at: 21 cm Tube secured with: Tape

## 2018-01-23 NOTE — MAU Note (Addendum)
Pt here with c/o pelvic pain and nausea. Has hx of ectopic pregnancy and "feels like it did before." Denies any bleeding. Left tube has been removed; still has right.

## 2018-01-23 NOTE — Progress Notes (Addendum)
Assumed care.   Provider at bs going over benefits and risks of surgery for ectopic  Labs and Iv done.   Pt prepped for surgery.   2144: anesthesia at bs assessing pt.   2150: dr. Earlene Plateravis at bs going over risks and benefits of surgery   2205: pt to OR via bed

## 2018-01-23 NOTE — Progress Notes (Signed)
Faculty Note  Patient seen pre-operatively. She is a 37 yo [id number] who started having pain today which worsened to the point that she had to come to hospital. Found to have HCG of 2893 and imaging concerning for right ectopic pregnancy as below. Has been using condoms for contraception, did not know she was pregnant. H/o left salpingectomy for ruptured ectopic pregnancy.  Review of EMR shows patient had laparoscopic tubal ligation in 2012.    IMPRESSION: 6.9 x 4.7 x 5.4 cm mass like area within the right adnexa containing 2 ring-enhancing cystic structures, 1 of which is highly concerning for ectopic pregnancy. Moderate volume complex fluid within the adjacent pelvis concerning for ruptured ectopic.   Reviewed recommendation for diagnostic laparoscopy given concern for ruptured ectopic pregnancy. Reviewed that her life would be in danger if bleeding continues. Reviewed that if pregnancy is in the tube, would remove her right tube. Reviewed that this would render her unable to have children without IVF (this was reviewed prior to learning of BTL, patient did not volunteer this information). Reviewed that if she has had tubal abort and tube is not bleeding, would leave in place. Reviewed that if pregnancy is ovarian, we may need to remove part or all the ovary. She verbalizes understanding of this and is agreeable to proceeding with surgery.  Reviewed risks of surgery with patient. Reviewed risks of surgery including infection, hemorrhage, damage to surrounding tissue and organs, possibility of laparotomy or other indicated procedures. She consents to blood transfusion if necessary.    NPO To OR when ready   K. Therese SarahMeryl Jermya Dowding, M.D. Attending Obstetrician & Gynecologist, [facility], [facility]

## 2018-01-23 NOTE — Op Note (Addendum)
Kristen Roberts PROCEDURE DATE: 01/23/2018  PREOPERATIVE DIAGNOSES: suspected ruptured ectopic pregnancy POSTOPERATIVE DIAGNOSES: The same PROCEDURE: laparoscopic right salpingectomy, evacuation of hemoperitoneum SURGEON:  Dr. Baldemar LenisK. Meryl Marguita Venning ASSISTANT: Dr. Candelaria CelesteJacob Stinson ANESTHESIOLOGIST: Dr. Sampson GoonFitzgerald  INDICATIONS: 37 y.o. [id number] with aforementioned preoperative diagnoses here today for definitive surgical management.   Risks of surgery were discussed with the patient including but not limited to: bleeding which may require transfusion or reoperation; infection which may require antibiotics; injury to bowel, bladder, ureters or other surrounding organs; need for additional procedures including laparotomy; thromboembolic phenomenon, incisional problems and other postoperative/anesthesia complications. Written informed consent was obtained.    FINDINGS:  Ruptured ectopic pregnancy in the right fallopian tube with evidence of prior tubal surgery with mid-portion of tube surgically absent, approx 250 mL of dark red clot and bright red blood noted in abdomen, left fallopian tube surgically absent, normal appearing bilateral ovaries, normal appearing uterus, no omental adhesions, liver with significant Fitzhugh Curtis adhesions  ANESTHESIA:    General INTRAVENOUS FLUIDS: 1000 ml ESTIMATED BLOOD LOSS: 150 ml SPECIMENS:  Right fallopian tube with ectopic pregnancy COMPLICATIONS: None immediate   PROCEDURE IN DETAIL:  The patient had sequential compression devices applied to her lower extremities while in the preoperative area.  She was then taken to the operating room where general anesthesia was administered.  She was placed in the dorsal lithotomy position, and was prepped and draped in a sterile manner.  A Foley catheter was inserted into her bladder and attached to constant drainage. A timeout was performed to verify patient and procedure. A uterine manipulator was then advanced into the uterus.  Attention was then turned to the patient's abdomen where a 5-mm skin incision was made at Palmer's point. The varess needle was advanced and the insufflation pressure did not drop appropriately so the varess needle was removed. A 5 mm skin incision was made at the umbilicus and the varess needle was advanced. The insufflation pressure did not drop appropriately and the varess needle was removed. Entry into the abdomen was subsequently accomplished with the Optiview 5 mm port and the abdomen insufflated with carbon monoxide gas to 15 mm. The camera was introduced and survey of the abdomen showed no trauma, bleeding or injury at either palmer's point or umbilicus with some insufflation noted in subcutaneous tissue.   Attention then turned to the pelvis with findings as noted above. A 10 mm port was placed into the right lower abdomen 2 cm proximal and median from the right ASIS after incision with a scalpel. The varess needle and sheath was advanced under direct visualization using the laparoscope. A 5 mm port was placed in the left lower abdomen 2 cm proximal and medial to the left ASIS after incision with the scalpel. The varess needle and sheath was advanced under direct visualization with the laparoscope. The patient was placed into trendelenburg and the right fallopian tube was grasped using an atraumatic grasper. Clots were cleared off the tube until site of rupture visualized. The fallopian tube had evidence of prior occlusion with the mid portion of the tube surgically absent. The distal portion of the fallopian tube including the site of rupture was removed using a 5 mm Ligasure to ligate and transect along the mesosalpinx just inferior to the tube. Hemostasis noted at ligation site. The tube was removed from the abdomen. The pelvis was irrigated and suctioned with hemostasis noted at ligation site. The ovaries were inspected and noted to be normal. The abdomen was again  inspected and no bleeding noted. At  this point the procedure was completed and the trocars were removed from the abdomen. The pneumoperitoneum was removed as much as possible. The fascia of the 10 mm port was closed using 0-Vicryl. The skin of all ports was closed using 4-0 Monocryl. The skin of the palmer's point incision was closed with a steri strip. The uterine manipulator was removed and no bleeding noted at the site of the tenaculum.   The patient was extubated without difficulty and taken to PACU in stable condition.   The patient will be discharged to home as per PACU criteria.  Routine postoperative instructions given.  She was prescribed Percocet, Ibuprofen and Colace.  She will follow up in the clinic in 2 weeks for postoperative evaluation.   Baldemar Lenis, M.D. Attending Obstetrician & Gynecologist, [facility] for [employer], [facility]

## 2018-01-23 NOTE — MAU Provider Note (Addendum)
History     CSN: [id number]  Arrival date and time: 01/23/18 1909  Chief Complaint  Patient presents with  . Pelvic Pain  . Nausea   HPI Kristen Roberts is a 37 y.o. [id number] at 858w4d who presents with right sided lower abdominal pain for 3 days. She states it has gradually gotten worse and is not relieved with tylenol or ibuprofen. She reports an LMP of 3/1. Denies any current vaginal bleeding or discharge. She has a history of a left ectopic pregnancy and had a left salpingectomy.  OB History    Gravida  1   Para      Term      Preterm      AB      Living        SAB      TAB      Ectopic      Multiple      Live Births              Past Medical History:  Diagnosis Date  . Seizures (HCC) 2007   No longer taking medications. Last instance was with pregnancy.     Past Surgical History:  Procedure Laterality Date  . ECTOPIC PREGNANCY SURGERY      No family history on file.  Social History   Tobacco Use  . Smoking status: Current Some Day Smoker    Packs/day: 0.50    Types: Cigarettes  . Smokeless tobacco: Never Used  Substance Use Topics  . Alcohol use: Yes    Comment: ocassionally  . Drug use: No    Allergies:  Allergies  Allergen Reactions  . Levetiracetam     REACTION: rash    Medications Prior to Admission  Medication Sig Dispense Refill Last Dose  . azithromycin (ZITHROMAX Z-PAK) 250 MG tablet 2 po day one, then 1 daily x 4 days 5 tablet 0   . cephALEXin (KEFLEX) 500 MG capsule Take 1 capsule (500 mg total) by mouth 2 (two) times daily. 14 capsule 0   . ibuprofen (ADVIL,MOTRIN) 800 MG tablet Take 1 tablet (800 mg total) by mouth 3 (three) times daily. 21 tablet 0   . naproxen sodium (ANAPROX) 220 MG tablet Take 440 mg by mouth daily as needed (dental pain).   06/03/2015 at Unknown time  . oxyCODONE-acetaminophen (PERCOCET/ROXICET) 5-325 MG per tablet Take 1-2 tablets by mouth every 4 (four) hours as needed for severe pain. 15 tablet 0      Review of Systems  Constitutional: Negative.  Negative for fatigue and fever.  HENT: Negative.   Respiratory: Negative.  Negative for shortness of breath.   Cardiovascular: Negative.  Negative for chest pain.  Gastrointestinal: Positive for abdominal pain. Negative for constipation, diarrhea, nausea and vomiting.  Genitourinary: Negative.  Negative for dysuria, vaginal bleeding and vaginal discharge.  Neurological: Negative.  Negative for dizziness and headaches.   Physical Exam   Blood pressure (!) 121/97, pulse (!) 102, resp. rate 20, height 5\' 6"  (1.676 m), weight 170 lb (77.1 kg), last menstrual period 12/29/2017, SpO2 99 %.  Physical Exam  Nursing note and vitals reviewed. Constitutional: She is oriented to person, place, and time. She appears well-developed and well-nourished. No distress.  HENT:  Head: Normocephalic.  Eyes: Pupils are equal, round, and reactive to light.  Cardiovascular: Normal rate, regular rhythm and normal heart sounds.  Respiratory: Effort normal and breath sounds normal. No respiratory distress.  GI: Soft. Bowel sounds are normal. She exhibits distension.  There is tenderness in the right lower quadrant and suprapubic area. There is guarding.  Neurological: She is alert and oriented to person, place, and time.  Skin: Skin is warm and dry.  Psychiatric: She has a normal mood and affect. Her behavior is normal. Judgment and thought content normal.    MAU Course  Procedures Results for orders placed or performed during the hospital encounter of 01/23/18 (from the past 24 hour(s))  Urinalysis, Routine w reflex microscopic     Status: Abnormal   Collection Time: 01/23/18  7:12 PM  Result Value Ref Range   Color, Urine YELLOW YELLOW   APPearance HAZY (A) CLEAR   Specific Gravity, Urine 1.024 1.005 - 1.030   pH 5.0 5.0 - 8.0   Glucose, UA NEGATIVE NEGATIVE mg/dL   Hgb urine dipstick NEGATIVE NEGATIVE   Bilirubin Urine NEGATIVE NEGATIVE   Ketones, ur  5 (A) NEGATIVE mg/dL   Protein, ur NEGATIVE NEGATIVE mg/dL   Nitrite NEGATIVE NEGATIVE   Leukocytes, UA NEGATIVE NEGATIVE  Pregnancy, urine POC     Status: Abnormal   Collection Time: 01/23/18  7:25 PM  Result Value Ref Range   Preg Test, Ur POSITIVE (A) NEGATIVE  CBC     Status: Abnormal   Collection Time: 01/23/18  8:12 PM  Result Value Ref Range   WBC 18.9 (H) 4.0 - 10.5 K/uL   RBC 3.92 3.87 - 5.11 MIL/uL   Hemoglobin 11.5 (L) 12.0 - 15.0 g/dL   HCT 16.1 (L) 09.6 - 04.5 %   MCV 88.5 78.0 - 100.0 fL   MCH 29.3 26.0 - 34.0 pg   MCHC 33.1 30.0 - 36.0 g/dL   RDW 40.9 81.1 - 91.4 %   Platelets 460 (H) 150 - 400 K/uL   US Ob Less Than 14 Weeks With Ob Transvaginal  Result Date: 01/23/2018 CLINICAL DATA:  Initial evaluation for acute pelvic pain. History of prior left ectopic pregnancy. EXAM: OBSTETRIC <14 WK [place] AND TRANSVAGINAL OB US TECHNIQUE: Both transabdominal and transvaginal ultrasound examinations were performed for complete evaluation of the gestation as well as the maternal uterus, adnexal regions, and pelvic cul-de-sac. Transvaginal technique was performed to assess early pregnancy. COMPARISON:  None. FINDINGS: Intrauterine gestational sac: None visualized. Yolk sac:  N/A Embryo:  N/A Cardiac Activity: N/A Heart Rate: N/A  bpm Subchorionic hemorrhage:  None visualized. Maternal uterus/adnexae: Right ovary is not discretely identified. In the right adnexa, there is an amorphous masslike region measuring 6.9 x 4.7 x 5.4 cm. Within this area, there are 2 adjacent cystic ring like structures, both of which demonstrate peripherally increased vascularity. The larger of these is favored to lie within the ovary and reflect a corpus luteal cyst, while the smaller of these is favored to lie immediately adjacent to the ovary and is highly concerning for an ectopic pregnancy. Findings are best seen on images 71 and 75. Moderate volume complex fluid seen within the adjacent pelvis, concerning for  ruptured ectopic. Left ovary normal in appearance.  No left adnexal mass. IMPRESSION: 6.9 x 4.7 x 5.4 cm mass like area within the right adnexa containing 2 ring-enhancing cystic structures, 1 of which is highly concerning for ectopic pregnancy. Moderate volume complex fluid within the adjacent pelvis concerning for ruptured ectopic. Critical Value/emergent results were called by telephone at the time of interpretation on 01/23/2018 at 8:53 pm to the mid wife taking care of the patient, Lennox Pippins , who verbally acknowledged these results. Electronically Signed   By: Sharlet Salina  Phill Myron M.D.   On: 01/23/2018 20:58   MDM UA, UPT CBC, HCG, ABO/Rh Wet prep and gc/chlamydia US OB Transvaginal US OB Comp Less 14 weeks-  Dr. Adrian Blackwater notified of ultrasound results and will come see patient.   Assessment and Plan   1. Ruptured right tubal ectopic pregnancy causing hemoperitoneum   2. Abdominal pain affecting pregnancy    -Patient NPO -Will start IV and draw type and screen. -Care turned over to Dr. Adrian Blackwater at 2115  Bentlie Catanzaro M [address] Runner  CNM 01/23/2018, 8:53 PM

## 2018-01-23 NOTE — Transfer of Care (Signed)
Immediate Anesthesia Transfer of Care Note  Patient: Kristen Roberts  Procedure(s) Performed: DIAGNOSTIC LAPAROSCOPY WITH RIGHT SALPINGECTOMT AND REMOVAL OF ECTOPIC PREGNANCY (N/A Abdomen)  Patient Location: PACU  Anesthesia Type:General  Level of Consciousness: awake, alert  and oriented  Airway & Oxygen Therapy: Patient Spontanous Breathing and Patient connected to nasal cannula oxygen  Post-op Assessment: Report given to RN and Post -op Vital signs reviewed and stable BP 124/65, HR 100, RR 20, SaO2 96%  Post vital signs: Reviewed and stable  Last Vitals:  Vitals Value Taken Time  BP 124/65 01/23/2018 11:46 PM  Temp    Pulse 114 01/23/2018 11:50 PM  Resp 22 01/23/2018 11:49 PM  SpO2 94 % 01/23/2018 11:50 PM  Vitals shown include unvalidated device data.  Last Pain:  Vitals:   01/23/18 2200  TempSrc:   PainSc: 4       Patients Stated Pain Goal: 5 (01/23/18 2200)  Complications: No apparent anesthesia complications

## 2018-01-23 NOTE — H&P (Signed)
History     CSN: [id number]  Arrival date and time: 01/23/18 1909  Chief Complaint  Patient presents with  . Pelvic Pain  . Nausea   HPI Kristen Roberts is a 37 y.o. [id number] at [redacted]w[redacted]d who presents with right sided lower abdominal pain for 3 days. She states it has gradually gotten worse and is not relieved with tylenol or ibuprofen. She reports an LMP of 3/1. Denies any current vaginal bleeding or discharge.   OB History    Gravida  1   Para      Term      Preterm      AB      Living        SAB      TAB      Ectopic      Multiple      Live Births              Past Medical History:  Diagnosis Date  . Seizures (HCC) 2007   No longer taking medications. Last instance was with pregnancy.     Past Surgical History:  Procedure Laterality Date  . ECTOPIC PREGNANCY SURGERY      No family history on file.  Social History   Tobacco Use  . Smoking status: Current Some Day Smoker    Packs/day: 0.50    Types: Cigarettes  . Smokeless tobacco: Never Used  Substance Use Topics  . Alcohol use: Yes    Comment: ocassionally  . Drug use: No    Allergies:  Allergies  Allergen Reactions  . Levetiracetam     REACTION: rash    Medications Prior to Admission  Medication Sig Dispense Refill Last Dose  . azithromycin (ZITHROMAX Z-PAK) 250 MG tablet 2 po day one, then 1 daily x 4 days 5 tablet 0   . cephALEXin (KEFLEX) 500 MG capsule Take 1 capsule (500 mg total) by mouth 2 (two) times daily. 14 capsule 0   . ibuprofen (ADVIL,MOTRIN) 800 MG tablet Take 1 tablet (800 mg total) by mouth 3 (three) times daily. 21 tablet 0   . naproxen sodium (ANAPROX) 220 MG tablet Take 440 mg by mouth daily as needed (dental pain).   06/03/2015 at Unknown time  . oxyCODONE-acetaminophen (PERCOCET/ROXICET) 5-325 MG per tablet Take 1-2 tablets by mouth every 4 (four) hours as needed for severe pain. 15 tablet 0     Review of Systems  Constitutional: Negative.  Negative for fatigue  and fever.  HENT: Negative.   Respiratory: Negative.  Negative for shortness of breath.   Cardiovascular: Negative.  Negative for chest pain.  Gastrointestinal: Positive for abdominal pain. Negative for constipation, diarrhea, nausea and vomiting.  Genitourinary: Negative.  Negative for dysuria, vaginal bleeding and vaginal discharge.  Neurological: Negative.  Negative for dizziness and headaches.   Physical Exam   Blood pressure (!) 121/97, pulse (!) 102, resp. rate 20, height 5\' 6"  (1.676 m), weight 170 lb (77.1 kg), last menstrual period 12/29/2017, SpO2 99 %.  Physical Exam  Nursing note and vitals reviewed. Constitutional: She is oriented to person, place, and time. She appears well-developed and well-nourished. No distress.  HENT:  Head: Normocephalic.  Eyes: Pupils are equal, round, and reactive to light.  Cardiovascular: Normal rate, regular rhythm and normal heart sounds.  Respiratory: Effort normal and breath sounds normal. No respiratory distress.  GI: Soft. Bowel sounds are normal. She exhibits distension. There is tenderness in the right lower quadrant and suprapubic area. There is  guarding.  Neurological: She is alert and oriented to person, place, and time.  Skin: Skin is warm and dry.  Psychiatric: She has a normal mood and affect. Her behavior is normal. Judgment and thought content normal.    MAU Course  Procedures Results for orders placed or performed during the hospital encounter of 01/23/18 (from the past 24 hour(s))  Urinalysis, Routine w reflex microscopic     Status: Abnormal   Collection Time: 01/23/18  7:12 PM  Result Value Ref Range   Color, Urine YELLOW YELLOW   APPearance HAZY (A) CLEAR   Specific Gravity, Urine 1.024 1.005 - 1.030   pH 5.0 5.0 - 8.0   Glucose, UA NEGATIVE NEGATIVE mg/dL   Hgb urine dipstick NEGATIVE NEGATIVE   Bilirubin Urine NEGATIVE NEGATIVE   Ketones, ur 5 (A) NEGATIVE mg/dL   Protein, ur NEGATIVE NEGATIVE mg/dL   Nitrite  NEGATIVE NEGATIVE   Leukocytes, UA NEGATIVE NEGATIVE  Pregnancy, urine POC     Status: Abnormal   Collection Time: 01/23/18  7:25 PM  Result Value Ref Range   Preg Test, Ur POSITIVE (A) NEGATIVE  CBC     Status: Abnormal   Collection Time: 01/23/18  8:12 PM  Result Value Ref Range   WBC 18.9 (H) 4.0 - 10.5 K/uL   RBC 3.92 3.87 - 5.11 MIL/uL   Hemoglobin 11.5 (L) 12.0 - 15.0 g/dL   HCT 56.234.7 (L) 13.036.0 - 86.546.0 %   MCV 88.5 78.0 - 100.0 fL   MCH 29.3 26.0 - 34.0 pg   MCHC 33.1 30.0 - 36.0 g/dL   RDW 78.415.3 69.611.5 - 29.515.5 %   Platelets 460 (H) 150 - 400 K/uL   [place] Ob Less Than 14 Weeks With Ob Transvaginal  Result Date: 01/23/2018 CLINICAL DATA:  Initial evaluation for acute pelvic pain. History of prior left ectopic pregnancy. EXAM: OBSTETRIC <14 WK US AND TRANSVAGINAL OB US TECHNIQUE: Both transabdominal and transvaginal ultrasound examinations were performed for complete evaluation of the gestation as well as the maternal uterus, adnexal regions, and pelvic cul-de-sac. Transvaginal technique was performed to assess early pregnancy. COMPARISON:  None. FINDINGS: Intrauterine gestational sac: None visualized. Yolk sac:  N/A Embryo:  N/A Cardiac Activity: N/A Heart Rate: N/A  bpm Subchorionic hemorrhage:  None visualized. Maternal uterus/adnexae: Right ovary is not discretely identified. In the right adnexa, there is an amorphous masslike region measuring 6.9 x 4.7 x 5.4 cm. Within this area, there are 2 adjacent cystic ring like structures, both of which demonstrate peripherally increased vascularity. The larger of these is favored to lie within the ovary and reflect a corpus luteal cyst, while the smaller of these is favored to lie immediately adjacent to the ovary and is highly concerning for an ectopic pregnancy. Findings are best seen on images 71 and 75. Moderate volume complex fluid seen within the adjacent pelvis, concerning for ruptured ectopic. Left ovary normal in appearance.  No left adnexal mass.  IMPRESSION: 6.9 x 4.7 x 5.4 cm mass like area within the right adnexa containing 2 ring-enhancing cystic structures, 1 of which is highly concerning for ectopic pregnancy. Moderate volume complex fluid within the adjacent pelvis concerning for ruptured ectopic. Critical Value/emergent results were called by telephone at the time of interpretation on 01/23/2018 at 8:53 pm to the mid wife taking care of the patient, Lennox PippinsCarolyn Neal , who verbally acknowledged these results. Electronically Signed   By: Rise MuBenjamin  McClintock M.D.   On: 01/23/2018 20:58   MDM UA, UPT  CBC, HCG, ABO/Rh Wet prep and gc/chlamydia US OB Transvaginal US OB Comp Less 14 weeks-  Dr. Adrian Blackwater notified of ultrasound results and will come see patient.   Assessment and Plan   1. Ruptured right tubal ectopic pregnancy causing hemoperitoneum   2. Abdominal pain affecting pregnancy    -Patient NPO -Start IV and draw type and screen. -CBC shows Hg >11. -Patient will need exploratory laparoscopic surgery with unilateral salpingectomy. Risk of surgery discussed with patient: Infection, bleeding, VTE, injury to intestines, bladder, ovaries, and other organs. -Team called in. To OR when ready.  Levie Heritage, DO 01/23/2018 9:29 PM

## 2018-01-24 ENCOUNTER — Encounter (HOSPITAL_COMMUNITY): Payer: Self-pay | Admitting: Obstetrics and Gynecology

## 2018-01-24 MED ORDER — HYDROMORPHONE HCL 1 MG/ML IJ SOLN
INTRAMUSCULAR | Status: AC
  Filled 2018-01-24: qty 0.5

## 2018-01-24 MED ORDER — ONDANSETRON HCL 4 MG PO TABS: 4.0000 mg | ORAL_TABLET | Freq: Four times a day (QID) | ORAL | Status: DC | PRN

## 2018-01-24 MED ORDER — MAGNESIUM HYDROXIDE 400 MG/5ML PO SUSP
30.0000 mL | Freq: Every day | ORAL | Status: DC | PRN
  Filled 2018-01-24: qty 30

## 2018-01-24 MED ORDER — ONDANSETRON HCL 4 MG/2ML IJ SOLN: 4.0000 mg | Freq: Four times a day (QID) | INTRAMUSCULAR | Status: DC | PRN

## 2018-01-24 MED ORDER — DOCUSATE SODIUM 100 MG PO CAPS: 100.0000 mg | ORAL_CAPSULE | Freq: Two times a day (BID) | ORAL | 0 refills | Status: DC | PRN

## 2018-01-24 MED ORDER — IBUPROFEN 600 MG PO TABS: 600.0000 mg | ORAL_TABLET | Freq: Four times a day (QID) | ORAL | Status: DC | PRN

## 2018-01-24 MED ORDER — OXYCODONE-ACETAMINOPHEN 5-325 MG PO TABS: 1.0000 | ORAL_TABLET | ORAL | Status: DC | PRN

## 2018-01-24 MED ORDER — LACTATED RINGERS IV SOLN: INTRAVENOUS | Status: DC

## 2018-01-24 MED ORDER — SIMETHICONE 80 MG PO CHEW
80.0000 mg | CHEWABLE_TABLET | Freq: Four times a day (QID) | ORAL | Status: DC | PRN
  Filled 2018-01-24: qty 1

## 2018-01-24 MED ORDER — OXYCODONE HCL 5 MG PO TABS: 5.0000 mg | ORAL_TABLET | Freq: Four times a day (QID) | ORAL | 0 refills | Status: DC | PRN

## 2018-01-24 MED ORDER — DOCUSATE SODIUM 100 MG PO CAPS
100.0000 mg | ORAL_CAPSULE | Freq: Two times a day (BID) | ORAL | Status: DC
  Filled 2018-01-24 (×3): qty 1

## 2018-01-24 NOTE — Anesthesia Postprocedure Evaluation (Signed)
Anesthesia Post Note  Patient: Kristen Roberts  Procedure(s) Performed: DIAGNOSTIC LAPAROSCOPY WITH RIGHT SALPINGECTOMT AND REMOVAL OF ECTOPIC PREGNANCY (N/A Abdomen)     Patient location during evaluation: PACU Anesthesia Type: General Level of consciousness: awake and alert Pain management: pain level controlled Vital Signs Assessment: post-procedure vital signs reviewed and stable Respiratory status: spontaneous breathing, nonlabored ventilation, respiratory function stable and patient connected to nasal cannula oxygen Cardiovascular status: blood pressure returned to baseline and stable Postop Assessment: no apparent nausea or vomiting Anesthetic complications: no    Last Vitals:  Vitals:   01/24/18 0115 01/24/18 0136  BP:  (!) 102/58  Pulse:  73  Resp:  18  Temp: 37.4 C 37.1 C  SpO2:  100%    Last Pain:  Vitals:   01/24/18 0136  TempSrc:   PainSc: 3    Pain Goal: Patients Stated Pain Goal: 5 (01/23/18 2200)               Kennieth RadFitzgerald, Rupinder Livingston E

## 2018-01-24 NOTE — Discharge Instructions (Signed)
Laparoscopic Salpingectomy, Care After Refer to this sheet in the next few weeks. These instructions provide you with information about caring for yourself after your procedure. Your health care provider may also give you more specific instructions. Your treatment has been planned according to current medical practices, but problems sometimes occur. Call your health care provider if you have any problems or questions after your procedure. What can I expect after the procedure? After the procedure, it is common to have:  A sore throat.  Discomfort in your shoulder.  Mild discomfort or cramping in your abdomen.  Gas pains.  Pain or soreness in the area where the surgical cut (incision) was made.  A bloated feeling.  Tiredness.  Nausea.  Vomiting.  Follow these instructions at home: Medicines  Take over-the-counter and prescription medicines only as told by your health care provider.  Do not take aspirin because it can cause bleeding.  Do not drive or operate heavy machinery while taking prescription pain medicine. Activity  Rest for the rest of the day.  Return to your normal activities as told by your health care provider. Ask your health care provider what activities are safe for you. Incision care   Follow instructions from your health care provider about how to take care of your incision. Make sure you: ? Wash your hands with soap and water before you change your bandage (dressing). If soap and water are not available, use hand sanitizer. ? Change your dressing as told by your health care provider. ? Leave stitches (sutures) in place. They may need to stay in place for 2 weeks or longer.  Check your incision area every day for signs of infection. Check for: ? More redness, swelling, or pain. ? More fluid or blood. ? Warmth. ? Pus or a bad smell. Other Instructions  Do not take baths, swim, or use a hot tub until your health care provider approves. You may take  showers.  Keep all follow-up visits as told by your health care provider. This is important.  Have someone help you with your daily household tasks for the first few days. Contact a health care provider if:  You have more redness, swelling, or pain around your incision.  Your incision feels warm to the touch.  You have pus or a bad smell coming from your incision.  The edges of your incision break open after the sutures have been removed.  Your pain does not improve after 2-3 days.  You have a rash.  You repeatedly become dizzy or light-headed.  Your pain medicine is not helping.  You are constipated. Get help right away if:  You have a fever.  You faint.  You have increasing pain in your abdomen.  You have severe pain in one or both of your shoulders.  You have fluid or blood coming from your sutures or from your vagina.  You have shortness of breath or difficulty breathing.  You have chest pain or leg pain.  You have ongoing nausea, vomiting, or diarrhea. This information is not intended to replace advice given to you by your health care provider. Make sure you discuss any questions you have with your health care provider. Document Released: 05/06/2005 Document Revised: 03/21/2016 Document Reviewed: 09/27/2015 Elsevier Interactive Patient Education  2018 [employer].   Post Anesthesia Home Care Instructions  Activity: Get plenty of rest for the remainder of the day. A responsible individual must stay with you for 24 hours following the procedure.  For the next  24 hours, DO NOT: -Drive a car -Advertising copywriterperate machinery -Drink alcoholic beverages -Take any medication unless instructed by your physician -Make any legal decisions or sign important papers.  Meals: Start with liquid foods such as gelatin or soup. Progress to regular foods as tolerated. Avoid greasy, spicy, heavy foods. If nausea and/or vomiting occur, drink only clear liquids until the nausea and/or  vomiting subsides. Call your physician if vomiting continues.  Special Instructions/Symptoms: Your throat may feel dry or sore from the anesthesia or the breathing tube placed in your throat during surgery. If this causes discomfort, gargle with warm salt water. The discomfort should disappear within 24 hours.  If you had a scopolamine patch placed behind your ear for the management of post- operative nausea and/or vomiting:  1. The medication in the patch is effective for 72 hours, after which it should be removed.  Wrap patch in a tissue and discard in the trash. Wash hands thoroughly with soap and water. 2. You may remove the patch earlier than 72 hours if you experience unpleasant side effects which may include dry mouth, dizziness or visual disturbances. 3. Avoid touching the patch. Wash your hands with soap and water after contact with the patch.     NO IBUPROFEN PRODUCTS (MOTRIN, ADVIL) OR ALEVE UNTIL 5:20AM TODAY.

## 2018-12-28 ENCOUNTER — Encounter (HOSPITAL_COMMUNITY): Payer: Self-pay

## 2019-04-17 ENCOUNTER — Telehealth: Payer: BC Managed Care – PPO | Admitting: Family

## 2019-04-17 DIAGNOSIS — J029 Acute pharyngitis, unspecified: Secondary | ICD-10-CM | POA: Diagnosis not present

## 2019-04-17 DIAGNOSIS — R05 Cough: Secondary | ICD-10-CM

## 2019-04-17 DIAGNOSIS — R059 Cough, unspecified: Secondary | ICD-10-CM

## 2019-04-17 NOTE — Progress Notes (Signed)
E-Visit for Corona Virus Screening   Your current symptoms could be consistent with the coronavirus.  Call your health care provider or local health department to request and arrange formal testing. Many health care providers can now test patients at their office but not all are.  Please quarantine yourself while awaiting your test results.  [facility] [phone], [facility] [phone], [facility] [phone] or visit [email or web address]/about-covid-19/testing/covid-19-testing-locations   You could have strep throat or COVID. I recommend going to one of the Urgent Cares that are doing COVID testing and can also do a  strep test to rule this out. You will have to call to see if they are seeing respiratory symptom patients. I know the Urgent Care in [place] are seeing respiratory patients.    Approximately 5 minutes was spent documenting and reviewing patient's chart.    COVID-19 is a respiratory illness with symptoms that are similar to the flu. Symptoms are typically mild to moderate, but there have been cases of severe illness and death due to the virus. The following symptoms may appear 2-14 days after exposure: . Fever . Cough . Shortness of breath or difficulty breathing . Chills . Repeated shaking with chills . Muscle pain . Headache . Sore throat . New loss of taste or smell . Fatigue . Congestion or runny nose . Nausea or vomiting . Diarrhea  It is vitally important that if you feel that you have an infection such as this virus or any other virus that you stay home and away from places where you may spread it to others.  You should self-quarantine for 14 days if you have symptoms that could potentially be coronavirus or have been in close contact a with a person diagnosed with COVID-19 within the last 2 weeks. You should avoid contact with people age 39 and older.   You should wear a mask or cloth  face covering over your nose and mouth if you must be around other people or animals, including pets (even at home). Try to stay at least 6 feet away from other people. This will protect the people around you.  You can use medication such as A prescription cough medication called Tessalon Perles 100 mg. You may take 1-2 capsules every 8 hours as needed for cough  You may also take acetaminophen (Tylenol) as needed for fever.   Reduce your risk of any infection by using the same precautions used for avoiding the common cold or flu:  Marland Kitchen. Wash your hands often with soap and warm water for at least 20 seconds.  If soap and water are not readily available, use an alcohol-based hand sanitizer with at least 60% alcohol.  . If coughing or sneezing, cover your mouth and nose by coughing or sneezing into the elbow areas of your shirt or coat, into a tissue or into your sleeve (not your hands). . Avoid shaking hands with others and consider head nods or verbal greetings only. . Avoid touching your eyes, nose, or mouth with unwashed hands.  . Avoid close contact with people who are sick. . Avoid places or events with large numbers of people in one location, like concerts or sporting events. . Carefully consider travel plans you have or are making. . If you are planning any travel outside or inside the [place], visit the CDC's Travelers' Health webpage for the latest health notices. . If you have some symptoms but not all symptoms, continue to monitor at home and  seek medical attention if your symptoms worsen. . If you are having a medical emergency, call 911.  HOME CARE . Only take medications as instructed by your medical team. . Drink plenty of fluids and get plenty of rest. . A steam or ultrasonic humidifier can help if you have congestion.   GET HELP RIGHT AWAY IF YOU HAVE EMERGENCY WARNING SIGNS** FOR COVID-19. If you or someone is showing any of these signs seek emergency medical care immediately. Call  911 or proceed to your closest emergency facility if: . You develop worsening high fever. . Trouble breathing . Bluish lips or face . Persistent pain or pressure in the chest . New confusion . Inability to wake or stay awake . You cough up blood. . Your symptoms become more severe  **This list is not all possible symptoms. Contact your medical provider for any symptoms that are sever or concerning to you.   MAKE SURE YOU   Understand these instructions.  Will watch your condition.  Will get help right away if you are not doing well or get worse.  Your e-visit answers were reviewed by a board certified advanced clinical practitioner to complete your personal care plan.  Depending on the condition, your plan could have included both over the counter or prescription medications.  If there is a problem please reply once you have received a response from your provider.  Your safety is important to [place].  If you have drug allergies check your prescription carefully.    You can use MyChart to ask questions about today's visit, request a non-urgent call back, or ask for a work or school excuse for 24 hours related to this e-Visit. If it has been greater than 24 hours you will need to follow up with your provider, or enter a new e-Visit to address those concerns. You will get an e-mail in the next two days asking about your experience.  I hope that your e-visit has been valuable and will speed your recovery. Thank you for using e-visits.

## 2019-04-18 ENCOUNTER — Telehealth: Payer: Self-pay

## 2019-04-18 NOTE — Telephone Encounter (Signed)
Second message left to call back to be scheduled for testing.

## 2019-04-18 NOTE — Telephone Encounter (Signed)
Left pt. Message to call back and schedule COVID 19 test.

## 2019-04-19 ENCOUNTER — Telehealth: Payer: Self-pay

## 2019-04-19 NOTE — Telephone Encounter (Signed)
Sharlot Gowda NP ordered COVID 19 test through E-visit. Third attempt to reach pt. Message left to call back.

## 2019-04-25 ENCOUNTER — Encounter: Payer: Self-pay | Admitting: Family Medicine

## 2019-04-25 ENCOUNTER — Other Ambulatory Visit: Payer: Self-pay | Admitting: Family Medicine

## 2019-04-25 ENCOUNTER — Other Ambulatory Visit: Payer: Self-pay

## 2019-04-25 ENCOUNTER — Ambulatory Visit (INDEPENDENT_AMBULATORY_CARE_PROVIDER_SITE_OTHER): Payer: BC Managed Care – PPO | Admitting: Family Medicine

## 2019-04-25 VITALS — BP 127/76 | HR 67 | Temp 98.6°F | Ht 66.0 in | Wt 172.0 lb

## 2019-04-25 DIAGNOSIS — Z130 Encounter for screening for diseases of the blood and blood-forming organs and certain disorders involving the immune mechanism: Secondary | ICD-10-CM | POA: Diagnosis not present

## 2019-04-25 DIAGNOSIS — N921 Excessive and frequent menstruation with irregular cycle: Secondary | ICD-10-CM | POA: Diagnosis not present

## 2019-04-25 DIAGNOSIS — F39 Unspecified mood [affective] disorder: Secondary | ICD-10-CM

## 2019-04-25 DIAGNOSIS — Z1329 Encounter for screening for other suspected endocrine disorder: Secondary | ICD-10-CM | POA: Diagnosis not present

## 2019-04-25 NOTE — Progress Notes (Signed)
6/25/20202:34 PM  Kristen Roberts [birth year], 38 y.o., female [id number]  Chief Complaint  Patient presents with  . Menstrual Problem    has irregular periods, hx of etopic pregnancy 12/2017, did get tubes tied and got pregnant 2 times after that. Has heavy clotting when she is on her cycle  . Anxiety    has a past of epilepsy, wants to talk about starting a mild medication. Current gig smoker, has [place]aby coming and does not want to smoke anymore  . Depression    HPI:   Patient is a 38 y.o. female with who presents today to establish care  H/o seizure, last one 2009, started during pregnancy Evaluated by neurology, started on medications, eventually weaned off medications  G5P2 BTL, ectopic pregnancy x 2 and one SAB Last ectopic pregnancy in 2019 Reports irregular menses, monthly to sometimes twice a month, with large clots, very painful periods Patient reports that she is always cold, wondering if anemia Reports occasional lightheadedness, SOB Denies chest pain, palpitations Due for pap Smokes - working in cutting back  Has had depression and anxiety before, diagnosed with bipolar disorder Recently having panic attacks [facility] - seroquel and zoloft, depakote  Too long back to remember how she was doing on that regime Denies h/o illicit drug use Drinks etoh rarely, fhx alcoholism PHQ9= 15, denies SI GAD7=15  Depression screen PHQ 2/9 04/25/2019  Decreased Interest 0  Down, Depressed, Hopeless 0  PHQ - 2 Score 0    Fall Risk  04/25/2019  Falls in the past year? 0  Number falls in past yr: 0  Injury with Fall? 0     Allergies  Allergen Reactions  . Levetiracetam Rash    Prior to Admission medications   Not on File    Past Medical History:  Diagnosis Date  . Depression   . Seizures ([facility]) 2007   No longer taking medications. Last instance was with pregnancy.     Past Surgical History:  Procedure Laterality Date  . DIAGNOSTIC  LAPAROSCOPY WITH REMOVAL OF ECTOPIC PREGNANCY N/A 01/23/2018   Procedure: DIAGNOSTIC LAPAROSCOPY WITH RIGHT SALPINGECTOMT AND REMOVAL OF ECTOPIC PREGNANCY;  Surgeon: Conan Bowensavis, Kelly M, MD;  Location: [facility];  Service: Gynecology;  Laterality: N/A;  . ECTOPIC PREGNANCY SURGERY      Social History   Tobacco Use  . Smoking status: Current Some Day Smoker    Packs/day: 0.50    Types: Cigarettes  . Smokeless tobacco: Never Used  Substance Use Topics  . Alcohol use: Yes    Comment: ocassionally    Family History  Problem Relation Age of Onset  . Heart disease Mother   . Hyperlipidemia Mother   . Miscarriages / [place] Mother   . Stroke Mother   . Varicose Veins Mother   . Alcohol abuse Father     Review of Systems  Constitutional: Negative for chills and fever.  Respiratory: Positive for shortness of breath. Negative for cough.   Cardiovascular: Negative for chest pain, palpitations and leg swelling.  Gastrointestinal: Negative for abdominal pain, nausea and vomiting.  Neurological: Positive for dizziness.  Psychiatric/Behavioral: Positive for depression. Negative for substance abuse and suicidal ideas. The patient is nervous/anxious.    Per hpi  OBJECTIVE:  Today's Vitals   04/25/19 1357  BP: 127/76  Pulse: 67  Temp: 98.6 F (37 C)  TempSrc: Oral  SpO2: 99%  Weight: 172 lb (78 kg)  Height: 5\' 6"  (1.676 m)  Body mass index is 27.76 kg/m.   Physical Exam Vitals signs and nursing note reviewed.  Constitutional:      Appearance: She is well-developed.  HENT:     Head: Normocephalic and atraumatic.     Mouth/Throat:     Pharynx: No oropharyngeal exudate.  Eyes:     General: No scleral icterus.    Conjunctiva/sclera: Conjunctivae normal.     Pupils: Pupils are equal, round, and reactive to light.  Neck:     Musculoskeletal: Neck supple.     Thyroid: No thyroid mass or thyromegaly.  Cardiovascular:     Rate and Rhythm: Normal rate and regular rhythm.      Heart sounds: Normal heart sounds. No murmur. No friction rub. No gallop.   Pulmonary:     Effort: Pulmonary effort is normal.     Breath sounds: Normal breath sounds. No wheezing or rales.  Skin:    General: Skin is warm and dry.  Neurological:     Mental Status: She is alert and oriented to person, place, and time.     ASSESSMENT and PLAN  1. Menorrhagia with irregular cycle Labs and [place] pending. Discussed briefly LARCs. - US Pelvic Complete With Transvaginal; Future  2. Screening for deficiency anemia - CBC  3. Screening for thyroid disorder - TSH  4. Mood disorder [facility]) Patient reports previous dx bipolar disorder, referring to psych for further eval and treatment - Comprehensive metabolic panel - Ambulatory referral to Psychiatry  Return in about 2 weeks (around 05/09/2019) for pap.    Rutherford Guys, MD Primary Care at [place],  [address] Ph.  [phone] Fax [phone]

## 2019-04-25 NOTE — Patient Instructions (Signed)
° ° ° °  If you have lab work done today you will be contacted with your lab results within the next 2 weeks.  If you have not heard from us then please contact us. The fastest way to get your results is to register for My Chart. ° ° °IF you received an x-ray today, you will receive an invoice from [facility]. Please contact [facility] at [phone] with questions or concerns regarding your invoice.  ° °IF you received labwork today, you will receive an invoice from [facility]. Please contact [facility] at [phone] with questions or concerns regarding your invoice.  ° °Our billing staff will not be able to assist you with questions regarding bills from these companies. ° °You will be contacted with the lab results as soon as they are available. The fastest way to get your results is to activate your My Chart account. Instructions are located on the last page of this paperwork. If you have not heard from us regarding the results in 2 weeks, please contact this office. °  ° ° ° °

## 2019-04-26 ENCOUNTER — Ambulatory Visit (INDEPENDENT_AMBULATORY_CARE_PROVIDER_SITE_OTHER): Payer: BC Managed Care – PPO | Admitting: Family Medicine

## 2019-04-26 DIAGNOSIS — N921 Excessive and frequent menstruation with irregular cycle: Secondary | ICD-10-CM

## 2019-04-26 LAB — COMPREHENSIVE METABOLIC PANEL
ALT: 9 IU/L (ref 0–32)
AST: 19 IU/L (ref 0–40)
Albumin/Globulin Ratio: 1.7 (ref 1.2–2.2)
Albumin: 4.1 g/dL (ref 3.8–4.8)
Alkaline Phosphatase: 79 IU/L (ref 39–117)
BUN/Creatinine Ratio: 12 (ref 9–23)
BUN: 9 mg/dL (ref 6–20)
Bilirubin Total: 0.3 mg/dL (ref 0.0–1.2)
CO2: 25 mmol/L (ref 20–29)
Calcium: 9.3 mg/dL (ref 8.7–10.2)
Chloride: 102 mmol/L (ref 96–106)
Creatinine, Ser: 0.76 mg/dL (ref 0.57–1.00)
GFR calc Af Amer: 115 mL/min/{1.73_m2} (ref >59)
GFR calc non Af Amer: 100 mL/min/{1.73_m2} (ref >59)
Globulin, Total: 2.4 g/dL (ref 1.5–4.5)
Glucose: 74 mg/dL (ref 65–99)
Potassium: 4.6 mmol/L (ref 3.5–5.2)
Sodium: 138 mmol/L (ref 134–144)
Total Protein: 6.5 g/dL (ref 6.0–8.5)

## 2019-04-26 LAB — TSH: TSH: 2.33 u[IU]/mL (ref 0.450–4.500)

## 2019-04-27 LAB — CBC
Hematocrit: 36.9 % (ref 34.0–46.6)
Hemoglobin: 12.4 g/dL (ref 11.1–15.9)
MCH: 30.8 pg (ref 26.6–33.0)
MCHC: 33.6 g/dL (ref 31.5–35.7)
MCV: 92 fL (ref 79–97)
Platelets: 426 10*3/uL (ref 150–450)
RBC: 4.02 x10E6/uL (ref 3.77–5.28)
RDW: 13.1 % (ref 11.7–15.4)
WBC: 13.6 10*3/uL — ABNORMAL HIGH (ref 3.4–10.8)

## 2019-05-08 ENCOUNTER — Telehealth: Payer: Self-pay

## 2019-05-09 ENCOUNTER — Ambulatory Visit (INDEPENDENT_AMBULATORY_CARE_PROVIDER_SITE_OTHER): Payer: BC Managed Care – PPO | Admitting: Family Medicine

## 2019-05-09 ENCOUNTER — Other Ambulatory Visit: Payer: Self-pay

## 2019-05-09 ENCOUNTER — Encounter: Payer: Self-pay | Admitting: Family Medicine

## 2019-05-09 ENCOUNTER — Other Ambulatory Visit (HOSPITAL_COMMUNITY)
Admission: RE | Admit: 2019-05-09 | Discharge: 2019-05-09 | Disposition: A | Discharge status: Home / Self Care | Payer: BC Managed Care – PPO | Source: Ambulatory Visit | Attending: Family Medicine | Admitting: Family Medicine

## 2019-05-09 VITALS — BP 114/77 | HR 77 | Temp 99.7°F | Resp 16 | Ht 65.5 in | Wt 175.0 lb

## 2019-05-09 DIAGNOSIS — Z3009 Encounter for other general counseling and advice on contraception: Secondary | ICD-10-CM

## 2019-05-09 DIAGNOSIS — Z113 Encounter for screening for infections with a predominantly sexual mode of transmission: Secondary | ICD-10-CM

## 2019-05-09 DIAGNOSIS — Z124 Encounter for screening for malignant neoplasm of cervix: Secondary | ICD-10-CM

## 2019-05-09 DIAGNOSIS — R87612 Low grade squamous intraepithelial lesion on cytologic smear of cervix (LGSIL): Secondary | ICD-10-CM

## 2019-05-09 NOTE — Patient Instructions (Signed)
° ° ° °  If you have lab work done today you will be contacted with your lab results within the next 2 weeks.  If you have not heard from us then please contact us. The fastest way to get your results is to register for My Chart. ° ° °IF you received an x-ray today, you will receive an invoice from [facility]. Please contact [facility] at [phone] with questions or concerns regarding your invoice.  ° °IF you received labwork today, you will receive an invoice from [facility]. Please contact [facility] at [phone] with questions or concerns regarding your invoice.  ° °Our billing staff will not be able to assist you with questions regarding bills from these companies. ° °You will be contacted with the lab results as soon as they are available. The fastest way to get your results is to activate your My Chart account. Instructions are located on the last page of this paperwork. If you have not heard from us regarding the results in 2 weeks, please contact this office. °  ° ° ° °

## 2019-05-09 NOTE — Progress Notes (Signed)
7/9/20205:40 PM  Kristen Roberts 1980-12-10, 38 y.o., female [id number]  Chief Complaint  Patient presents with  . Gynecologic Exam    pap smear only    HPI:   Patient is a 38 y.o. female with past medical history significant for menorrhagia, mood disorder, seziures who presents today for pap  G5P2 Ectopic x 2 and one SAB BTL Last pap > 3 years ago Denies h/o abnormal pap Menorrhagia, large clots, painful Smokes   Lab Results  Component Value Date   WBC 13.6 (H) 04/26/2019   HGB 12.4 04/26/2019   HCT 36.9 04/26/2019   MCV 92 04/26/2019   PLT 426 04/26/2019   Lab Results  Component Value Date   TSH 2.330 04/25/2019    Depression screen PHQ 2/9 05/09/2019 04/25/2019 04/25/2019  Decreased Interest 2 2 0  Down, Depressed, Hopeless 3 3 0  PHQ - 2 Score 5 5 0  Altered sleeping 1 1 -  Tired, decreased energy 3 2 -  Change in appetite 1 2 -  Feeling bad or failure about yourself  3 3 -  Trouble concentrating 1 1 -  Moving slowly or fidgety/restless 2 1 -  Suicidal thoughts 0 0 -  PHQ-9 Score 16 15 -  Difficult doing work/chores - Very difficult -    Fall Risk  05/09/2019 04/25/2019  Falls in the past year? 0 0  Number falls in past yr: - 0  Injury with Fall? - 0  Follow up Falls evaluation completed -     Allergies  Allergen Reactions  . Levetiracetam Rash    Prior to Admission medications   Medication Sig Start Date End Date Taking? Authorizing Provider  BLACK CURRANT SEED OIL PO Take by mouth as needed.   Yes [provider]    Past Medical History:  Diagnosis Date  . Depression   . Seizures ([place]) 2007   No longer taking medications. Last instance was with pregnancy.     Past Surgical History:  Procedure Laterality Date  . DIAGNOSTIC LAPAROSCOPY WITH REMOVAL OF ECTOPIC PREGNANCY N/A 01/23/2018   Procedure: DIAGNOSTIC LAPAROSCOPY WITH RIGHT SALPINGECTOMT AND REMOVAL OF ECTOPIC PREGNANCY;  Surgeon: Sloan Leiter, MD;  Location: [facility];   Service: Gynecology;  Laterality: N/A;  . ECTOPIC PREGNANCY SURGERY      Social History   Tobacco Use  . Smoking status: Current Some Day Smoker    Packs/day: 0.50    Types: Cigarettes  . Smokeless tobacco: Never Used  Substance Use Topics  . Alcohol use: Yes    Comment: ocassionally    Family History  Problem Relation Age of Onset  . Heart disease Mother   . Hyperlipidemia Mother   . Miscarriages / [place] Mother   . Stroke Mother   . Varicose Veins Mother   . Alcohol abuse Father     ROS Per hpi  OBJECTIVE:  Today's Vitals   05/09/19 1723  BP: 114/77  Pulse: 77  Resp: 16  Temp: 99.7 F (37.6 C)  TempSrc: Oral  SpO2: 100%  Weight: 175 lb (79.4 kg)  Height: 5' 5.5" (1.664 m)   Body mass index is 28.68 kg/m.   Physical Exam Vitals signs and nursing note reviewed. Exam conducted with a chaperone present.  Constitutional:      Appearance: She is well-developed.  HENT:     Head: Normocephalic and atraumatic.  Eyes:     General: No scleral icterus.    Conjunctiva/sclera: Conjunctivae normal.  Pupils: Pupils are equal, round, and reactive to light.  Neck:     Musculoskeletal: Neck supple.  Pulmonary:     Effort: Pulmonary effort is normal.  Genitourinary:    Labia:        Right: No rash or lesion.        Left: No rash or lesion.      Vagina: No vaginal discharge, erythema or lesions.     Cervix: No cervical motion tenderness, discharge, friability or lesion.     Uterus: Not enlarged, not fixed and not tender.      Adnexa:        Right: No tenderness or fullness.         Left: No tenderness or fullness.    Skin:    General: Skin is warm and dry.  Neurological:     Mental Status: She is alert and oriented to person, place, and time.      ASSESSMENT and PLAN  1. Cervical cancer screening - Cytology - PAP([place])  2. Screening examination for STD (sexually transmitted disease) - Cytology - PAP([place])  3. Encounter for other  general counseling or advice on contraception Discussed mirena IUD, provided patient educational handout   Other orders   Return for after US for IUD placement.    Myles LippsIrma M Santiago, MD [facility] at [facility] [address] [place], [place] [address] Ph.  [phone] Fax [phone]

## 2019-05-14 LAB — CYTOLOGY - PAP
Chlamydia: NEGATIVE
HPV: NOT DETECTED
Neisseria Gonorrhea: NEGATIVE

## 2019-05-20 NOTE — Addendum Note (Signed)
Addended by: Rutherford Guys on: 05/20/2019 09:28 AM   Modules accepted: Orders

## 2019-06-10 ENCOUNTER — Other Ambulatory Visit (HOSPITAL_COMMUNITY): Payer: BC Managed Care – PPO

## 2019-06-11 ENCOUNTER — Ambulatory Visit (HOSPITAL_COMMUNITY): Payer: Self-pay

## 2019-06-18 ENCOUNTER — Ambulatory Visit (HOSPITAL_COMMUNITY): Payer: Medicaid Other

## 2019-06-18 ENCOUNTER — Encounter (HOSPITAL_COMMUNITY): Payer: Self-pay

## 2019-06-25 ENCOUNTER — Telehealth: Payer: Self-pay | Admitting: Family Medicine

## 2019-06-26 NOTE — Telephone Encounter (Signed)
Authorization has been updated for this patient for her [place]

## 2019-06-27 ENCOUNTER — Ambulatory Visit (HOSPITAL_COMMUNITY)
Admission: RE | Admit: 2019-06-27 | Discharge: 2019-06-27 | Disposition: A | Discharge status: Home / Self Care | Payer: Medicaid Other | Source: Ambulatory Visit | Attending: Family Medicine | Admitting: Family Medicine

## 2019-06-27 ENCOUNTER — Other Ambulatory Visit: Payer: Self-pay

## 2019-06-27 DIAGNOSIS — N921 Excessive and frequent menstruation with irregular cycle: Secondary | ICD-10-CM | POA: Insufficient documentation

## 2020-02-27 NOTE — Telephone Encounter (Signed)
No action needed
